# Patient Record
Sex: Male | Born: 1995 | Race: Black or African American | Hispanic: No | Marital: Single | Smoking: Never smoker
Health system: Southern US, Community
[De-identification: ages and names within clinical notes are randomized; demographics above are authoritative.]

## PROBLEM LIST (undated history)

## (undated) DIAGNOSIS — M899 Disorder of bone, unspecified: Secondary | ICD-10-CM

## (undated) DIAGNOSIS — J309 Allergic rhinitis, unspecified: Secondary | ICD-10-CM

## (undated) DIAGNOSIS — E669 Obesity, unspecified: Secondary | ICD-10-CM

## (undated) DIAGNOSIS — D573 Sickle-cell trait: Secondary | ICD-10-CM

## (undated) HISTORY — DX: Allergic rhinitis, unspecified: J30.9

## (undated) HISTORY — DX: Obesity, unspecified: E66.9

## (undated) HISTORY — DX: Disorder of bone, unspecified: M89.9

## (undated) HISTORY — DX: Sickle-cell trait: D57.3

---
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
## 2015-01-08 ENCOUNTER — Telehealth: Payer: Self-pay | Admitting: Internal Medicine

## 2015-01-08 NOTE — Telephone Encounter (Signed)
Called patient to confirm appt for 01/09/15 at 9:15  Patient confirmed

## 2015-01-09 ENCOUNTER — Ambulatory Visit (INDEPENDENT_AMBULATORY_CARE_PROVIDER_SITE_OTHER): Payer: BLUE CROSS/BLUE SHIELD | Admitting: Internal Medicine

## 2015-01-09 ENCOUNTER — Encounter: Payer: Self-pay | Admitting: Internal Medicine

## 2015-01-09 VITALS — BP 139/57 | HR 56 | Temp 98.5°F | Ht 73.0 in | Wt 242.6 lb

## 2015-01-09 DIAGNOSIS — G44229 Chronic tension-type headache, not intractable: Secondary | ICD-10-CM | POA: Insufficient documentation

## 2015-01-09 DIAGNOSIS — J329 Chronic sinusitis, unspecified: Secondary | ICD-10-CM | POA: Diagnosis not present

## 2015-01-09 DIAGNOSIS — J324 Chronic pansinusitis: Secondary | ICD-10-CM

## 2015-01-09 DIAGNOSIS — R51 Headache: Secondary | ICD-10-CM

## 2015-01-09 MED ORDER — FLUTICASONE PROPIONATE 50 MCG/ACT NA SUSP: 2.0000 | Freq: Every day | NASAL | Status: DC

## 2015-01-09 MED ORDER — IBUPROFEN 200 MG PO TABS: 400.0000 mg | ORAL_TABLET | Freq: Four times a day (QID) | ORAL | Status: DC | PRN

## 2015-01-09 MED ORDER — CETIRIZINE HCL 5 MG PO TABS: 10.0000 mg | ORAL_TABLET | Freq: Every day | ORAL | Status: DC

## 2015-01-09 NOTE — Patient Instructions (Signed)
You are likely experiencing tension type headaches caused by your sinusitis. Take ibuprofen for you headaches when they occur. Use Cetirizine 10 mg and Flonase 2 sprays each nostril every day to help with your sinusitis and also help prevent headaches. If you continue to have these headaches in one month and they are not getting better, please return to the clinic.  Tension Headache A tension headache is a feeling of pain, pressure, or aching that is often felt over the front and sides of the head. The pain can be dull, or it can feel tight (constricting). Tension headaches are not normally associated with nausea or vomiting, and they do not get worse with physical activity. Tension headaches can last from 30 minutes to several days. This is the most common type of headache. CAUSES The exact cause of this condition is not known. Tension headaches often begin after stress, anxiety, or depression. Other triggers may include:  Alcohol.  Too much caffeine, or caffeine withdrawal.  Respiratory infections, such as colds, flu, or sinus infections.  Dental problems or teeth clenching.  Fatigue.  Holding your head and neck in the same position for a long period of time, such as while using a computer.  Smoking. SYMPTOMS Symptoms of this condition include:  A feeling of pressure around the head.  Dull, aching head pain.  Pain felt over the front and sides of the head.  Tenderness in the muscles of the head, neck, and shoulders. DIAGNOSIS This condition may be diagnosed based on your symptoms and a physical exam. Tests may be done, such as a CT scan or an MRI of your head. These tests may be done if your symptoms are severe or unusual. TREATMENT This condition may be treated with lifestyle changes and medicines to help relieve symptoms. HOME CARE INSTRUCTIONS Managing Pain  Take over-the-counter and prescription medicines only as told by your health care provider.  Lie down in a dark,  quiet room when you have a headache.  If directed, apply ice to the head and neck area:  Put ice in a plastic bag.  Place a towel between your skin and the bag.  Leave the ice on for 20 minutes, 2-3 times per day.  Use a heating pad or a hot shower to apply heat to the head and neck area as told by your health care provider. Eating and Drinking  Eat meals on a regular schedule.  Limit alcohol use.  Decrease your caffeine intake, or stop using caffeine. General Instructions  Keep all follow-up visits as told by your health care provider. This is important.  Keep a headache journal to help find out what may trigger your headaches. For example, write down:  What you eat and drink.  How much sleep you get.  Any change to your diet or medicines.  Try massage or other relaxation techniques.  Limit stress.  Sit up straight, and avoid tensing your muscles.  Do not use tobacco products, including cigarettes, chewing tobacco, or e-cigarettes. If you need help quitting, ask your health care provider.  Exercise regularly as told by your health care provider.  Get 7-9 hours of sleep, or the amount recommended by your health care provider. SEEK MEDICAL CARE IF:  Your symptoms are not helped by medicine.  You have a headache that is different from what you normally experience.  You have nausea or you vomit.  You have a fever. SEEK IMMEDIATE MEDICAL CARE IF:  Your headache becomes severe.  You have repeated  vomiting.  You have a stiff neck.  You have a loss of vision.  You have problems with speech.  You have pain in your eye or ear.  You have muscular weakness or loss of muscle control.  You lose your balance or you have trouble walking.  You feel faint or you pass out.  You have confusion.   This information is not intended to replace advice given to you by your health care provider. Make sure you discuss any questions you have with your health care  provider.   Document Released: 12/20/2004 Document Revised: 09/10/2014 Document Reviewed: 04/14/2014 Elsevier Interactive Patient Education 2016 [employer]  Cluster Headache Cluster headaches are recognized by their pattern of deep, intense head pain. They normally occur on one side of your head, but they may "switch sides" in subsequent episodes. Typically, cluster headaches:   Are severe in nature.   Occur repeatedly over weeks to months and are followed by periods of no headaches.   Can last from 15 minutes to 3 hours.   Occur at the same time each day, often at night.   Occur several times a day. CAUSES The exact cause of cluster headaches is not known. Alcohol use may be associated with cluster headaches. SIGNS AND SYMPTOMS   Severe pain that begins in or around your eye or temple.   One-sided head pain.   Feeling sick to your stomach (nauseous).   Sensitivity to light.   Runny nose.   Eye redness, tearing, and nasal stuffiness on the side of your head where you are experiencing pain.   Sweaty, pale skin of the face.   Droopy or swollen eyelid.   Restlessness. DIAGNOSIS  Cluster headaches are diagnosed based on symptoms and a physical exam. Your health care provider may order a CT scan or an MRI of your head or lab tests to see if your headaches are caused by other medical conditions.  TREATMENT   Medicines for pain relief and to prevent recurrent attacks. Some people may need a combination of medicines.  Oxygen for pain relief.   Biofeedback programs to help reduce headache pain.  It may be helpful to keep a headache diary. This may help you find a trend for what is triggering your headaches. Your health care provider can develop a treatment plan.  HOME CARE INSTRUCTIONS  During cluster periods:   Follow a regular sleep schedule. Do not vary the amount and time that you sleep from day to day. It is important to stay on the same schedule  during a cluster period to help prevent headaches.   Avoid alcohol.   Stop smoking if you smoke.  SEEK MEDICAL CARE IF:  You have any changes from your previous cluster headaches either in intensity or frequency.   You are not getting relief from medicines you are taking.  SEEK IMMEDIATE MEDICAL CARE IF:   You faint.   You have weakness or numbness, especially on one side of your body or face.   You have double vision.   You have nausea or vomiting that is not relieved within several hours.   You cannot keep your balance or have difficulty talking or walking.   You have neck pain or stiffness.   You have a fever. MAKE SURE YOU:  Understand these instructions.   Will watch your condition.   Will get help right away if you are not doing well or get worse.   This information is not intended to replace advice  given to you by your health care provider. Make sure you discuss any questions you have with your health care provider.   Document Released: 12/20/2004 Document Revised: 10/10/2012 Document Reviewed: 07/12/2012 Elsevier Interactive Patient Education [employer]

## 2015-01-09 NOTE — Assessment & Plan Note (Signed)
Patient presents with a  1-2 month history of right sided headaches located behind right eye and right temple. Episodes occur every other day or every 2-3 days and last for about one hour. Pain is throbbing and tension like. At its worst it gets to a 10/10. It is sometimes associated with tearing of the right eye, but is not associated with nausea, vomiting, fever, photophobia, phonophobia, eyelid edema or conjunctival injection. When pain occurs, he will rest and put pressure on the area. Pain resolves on its own in 1 hour. He has tried advil one time with some relief. He has never had headaches like this before. He denies any new activities, medicines or supplements. He does admits to feeling more congested recently which is a chronic problem for him.  Assessment: Tension-type headache 2/2 chronic sinusitis Other DDx include Cluster headache and migraine headache, but less likely.  Plan: -Advil 400 mg Q6H prn pain -Flonase and Cetirizine for sinusitis -Provided information on tension headache -Return in one month if sometime are not improved, consider cluster headache at that time

## 2015-01-09 NOTE — Progress Notes (Signed)
   Subjective:    Patient ID: Jose Williams, male    DOB: 03/06/1995, 20 y.o.   MRN: [id number]  HPI Jose Williams is a 20 y.o. male with PMHx of sickle cell trait, allergic rhinitis who presents to the clinic for migraines. Please see A&P for the status of the patient's chronic medical problems.   Past Medical History  Diagnosis Date  . Bone lesion 04/25/2014  . Allergic rhinitis 04/25/2014  . Obesity 04/25/2014  . Sickle cell trait (HCC) 04/25/2014    Outpatient Encounter Prescriptions as of 01/09/2015  Medication Sig Note  . levocetirizine (XYZAL) 5 MG tablet Take 5 mg by mouth daily. 04/25/2014: Received from: External Pharmacy   No facility-administered encounter medications on file as of 01/09/2015.    No family history on file.  Social History   Social History  . Marital Status: Single    Spouse Name: N/A  . Number of Children: N/A  . Years of Education: N/A   Occupational History  . Not on file.   Social History Main Topics  . Smoking status: Never Smoker   . Smokeless tobacco: Not on file  . Alcohol Use: No  . Drug Use: Not on file  . Sexual Activity: Not on file   Other Topics Concern  . Not on file   Social History Narrative   Review of Systems General: Denies fever, chills, fatigue.  HEENT: Admits to conjunctival lacrimation, pain behind right eye, chronic nasal congestion and sinus pressure. Denies conjunctival injection or eyelid edema. Respiratory: Denies SOB, cough, chest tightness, and wheezing.   Cardiovascular: Denies chest pain and palpitations.  Gastrointestinal: Denies nausea, vomiting, diarrhea, constipation Skin: Denies rash.  Neurological: Admits to right sided headaches. Denies dizziness, weakness, lightheadedness, numbness, Psychiatric/Behavioral: Denies confusion or increased stress.     Objective:   Physical Exam Filed Vitals:   01/09/15 0917  BP: 139/57  Pulse: 56  Temp: 98.5 F (36.9 C)  TempSrc: Oral  Height: 6\' 1"   (1.854 m)  Weight: 242 lb 9.6 oz (110.043 kg)  SpO2: 100%   General: Vital signs reviewed.  Patient is well-developed and well-nourished, in no acute distress and cooperative with exam.  Head: Normocephalic and atraumatic. Tender on palpation of frontal and maxillary sinuses. Eyes: EOMI, conjunctivae normal, no conjunctival injection or eyelid edema.  Nose: Edematous, erythematous nasal turbinates.  Mouth: Normal posterior oropharynx. Neck: Supple, trachea midline, normal ROM.  Cardiovascular: RRR, S1 normal, S2 normal, no murmurs, gallops, or rubs. Pulmonary/Chest: Clear to auscultation bilaterally, no wheezes, rales, or rhonchi. Abdominal: Soft, non-tender, non-distended, BS + Extremities: No lower extremity edema bilaterally Neurological: Strength is normal and symmetric bilaterally, no focal motor deficit, sensory intact to light touch bilaterally.  Skin: Warm, dry and intact.     Assessment & Plan:   Please see problem based assessment and plan.

## 2015-01-09 NOTE — Assessment & Plan Note (Signed)
Patient admits to worsening nasal congestion and sinus pressure, worse on the right, that is chronic for him. He was previously on Xyzal, but has not been taking it.   Assessment: Chronic Sinusitis  Plan: -Flonase 2 sprays each nostril QD -Cetirizine 10 mg QD -Follow up if symptoms not improved -Consider ENT referral if worsening

## 2015-01-12 NOTE — Progress Notes (Signed)
Internal Medicine Clinic Attending  Case discussed with Dr. Richardson at the time of the visit.  We reviewed the resident's history and exam and pertinent patient test results.  I agree with the assessment, diagnosis, and plan of care documented in the resident's note. 

## 2015-08-14 ENCOUNTER — Encounter: Payer: Self-pay | Admitting: Internal Medicine

## 2015-08-14 ENCOUNTER — Ambulatory Visit (INDEPENDENT_AMBULATORY_CARE_PROVIDER_SITE_OTHER): Payer: BLUE CROSS/BLUE SHIELD | Admitting: Internal Medicine

## 2015-08-14 VITALS — BP 139/77 | HR 85 | Temp 98.8°F | Wt 223.4 lb

## 2015-08-14 DIAGNOSIS — J329 Chronic sinusitis, unspecified: Secondary | ICD-10-CM

## 2015-08-14 DIAGNOSIS — J320 Chronic maxillary sinusitis: Secondary | ICD-10-CM

## 2015-08-14 DIAGNOSIS — J324 Chronic pansinusitis: Secondary | ICD-10-CM

## 2015-08-14 MED ORDER — SALINE SPRAY 0.65 % NA SOLN: 2.0000 | NASAL | 2 refills | Status: DC | PRN

## 2015-08-14 MED ORDER — FLUTICASONE PROPIONATE 50 MCG/ACT NA SUSP: 2.0000 | Freq: Every day | NASAL | 3 refills | Status: DC

## 2015-08-14 MED ORDER — FEXOFENADINE-PSEUDOEPHED ER 60-120 MG PO TB12: 1.0000 | ORAL_TABLET | Freq: Two times a day (BID) | ORAL | 2 refills | Status: AC

## 2015-08-14 NOTE — Patient Instructions (Signed)
It was pleasure taking care of you. I stopped your Cetrizine and gave you allegra D , see it might work for you. Do saline nasal spray to help with your congestion. We are refering you to a ENT specialist, You will get a call about your appointment.

## 2015-08-14 NOTE — Assessment & Plan Note (Signed)
According to patient his nasal congestion is not getting better inspite of taking Zyrtec and Flonase regularly. He is having moderate amount of edema and erythema bilaterally with some clear nasal discharge. -Try Allegra D and stop Zyrtec -Saline nasal spray -Continue Flonase -ENT referral

## 2015-08-14 NOTE — Progress Notes (Signed)
Internal Medicine Clinic Attending  I saw and evaluated the patient.  I personally confirmed the key portions of the history and exam documented by Dr. Amin and I reviewed pertinent patient test results.  The assessment, diagnosis, and plan were formulated together and I agree with the documentation in the resident's note. 

## 2015-08-14 NOTE — Progress Notes (Signed)
   CC: Sinus Congestion  HPI: Mr. Pamala HurryMarcquez D Puett is a 20 y.o. male with a PMHx of chronic allergic sinusitis, sickle cell trait, presented today at clinic with nasal congestion since long time. He was given Zyrtec and Flonase in the past and it's not helping. He has an h/o seasonal allergies. He admits having some post nasal drip and unable to sleep at night due to nasal congestion.He denies any headache,facial pain,change in vision, sore throat,fever, SOB,chest pain.  Mr.Izzak D Mercy RidingBurgess is a 20 y.o.   Past Medical History:  Diagnosis Date  . Allergic rhinitis 04/25/2014  . Bone lesion 04/25/2014  . Obesity 04/25/2014  . Sickle cell trait (HCC) 04/25/2014    Review of Systems:  Constitutional:  denies fever, chills, diaphoresis, appetite change and fatigue.  HEENT: denies photophobia, eye pain, redness, hearing loss, ear pain,Nasal congestion, sore throat, rhinorrhea, sneezing, neck pain, neck stiffness and tinnitus.  Respiratory: denies SOB, DOE, cough, chest tightness, and wheezing.  Cardiovascular: denies chest pain, palpitations and leg swelling.  Gastrointestinal: denies nausea, vomiting, abdominal pain, diarrhea, constipation, blood in stool.  Genitourinary: denies dysuria, urgency, frequency, hematuria, flank pain and difficulty urinating.  Musculoskeletal: denies  myalgias, back pain, joint swelling, arthralgias and gait problem.   Skin: denies pallor, rash and wound.  Neurological: denies dizziness, seizures, syncope, weakness, light-headedness, numbness and headaches.   Hematological: denies adenopathy, easy bruising, personal or family bleeding history.  Psychiatric/ Behavioral: denies suicidal ideation, mood changes, confusion, nervousness, sleep disturbance and agitation.    Physical Exam:  Vitals:   08/14/15 1352  BP: 139/77  Pulse: 85  Temp: 98.8 F (37.1 C)  TempSrc: Oral  SpO2: 100%  Weight: 223 lb 6.4 oz (101.3 kg)   General: Well built, well nourished,  comfortable, no acute distress. HEENT: PERRLA, no conjunctival erythema or scleral icterus EAR: Normal ear drum with wax in both ears. Nose: Bilateral erythema, edema with clear nasal discharge present in both nares. Throat: Erythematous enlarged tonsils. No exudate. Face: No tenderness,or edema. Neck: No lymphadenopathy, supple, No thyromegaly. Chest: Clear bilaterally, No added sounds. CVS: RRR, No R/M/G. Abdomen: Soft, non tender, non distended. BS +ve. CNS: Grossly intact.   Assessment & Plan:   See Encounters Tab for problem based charting.  Patient seen with Dr. Heide SparkNarendra

## 2015-10-23 NOTE — Addendum Note (Signed)
Addended by: Neomia DearPOWERS, Kalee Broxton E on: 10/23/2015 04:47 PM   Modules accepted: Orders

## 2016-04-18 IMAGING — DX DG ANKLE COMPLETE 3+V*L*
3 series · 3 of 3 positions shown · non-contrast
Comparison: 05/02/2012

CLINICAL DATA: Pain over the left ankle with no known injury.

EXAM:
LEFT ANKLE COMPLETE - 3+ VIEW

[ankle ap]
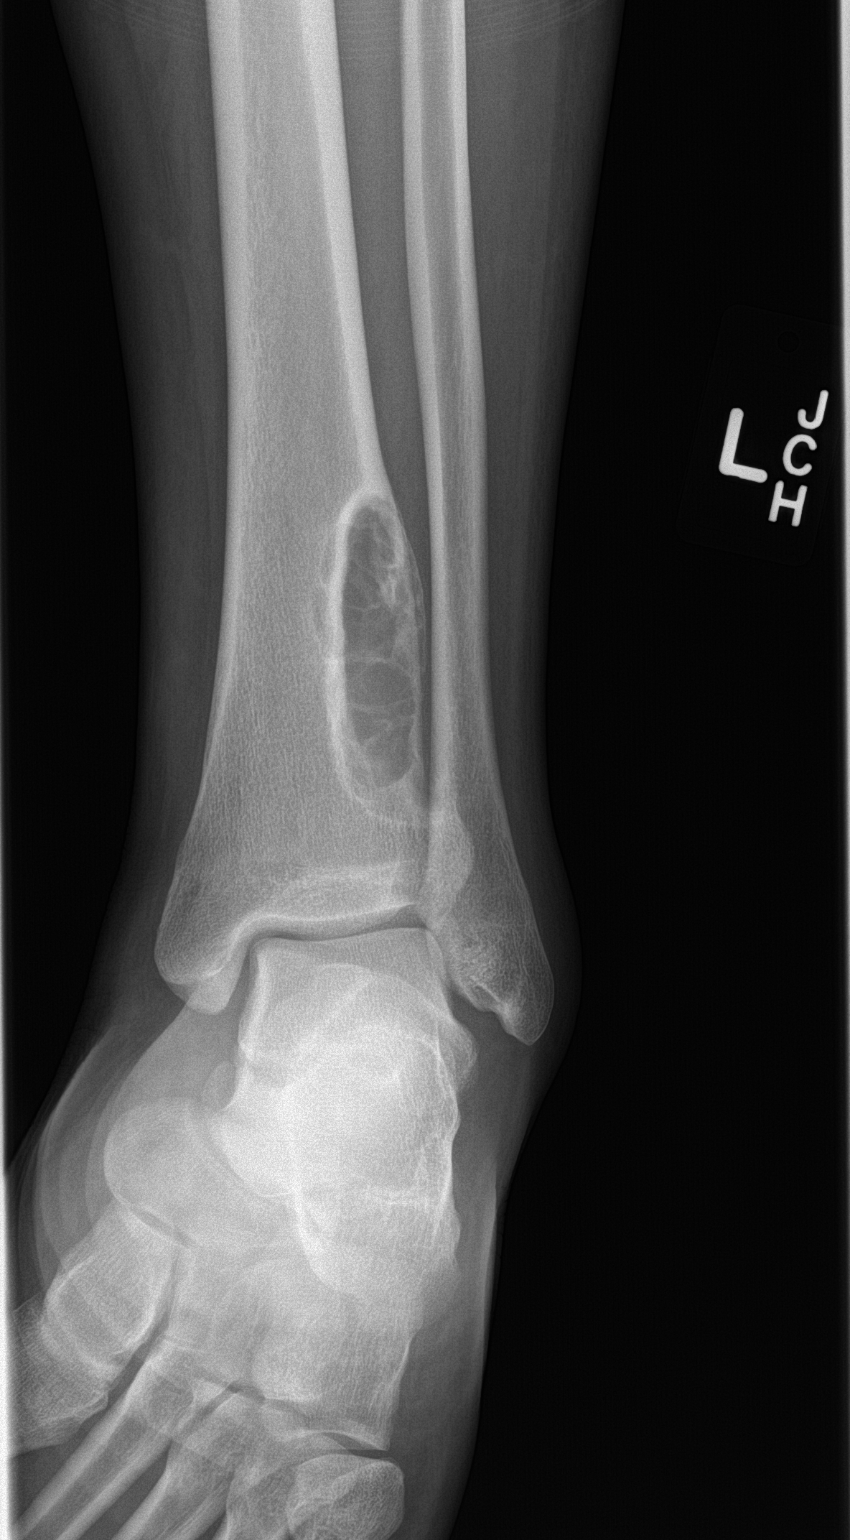

[ankle obl]
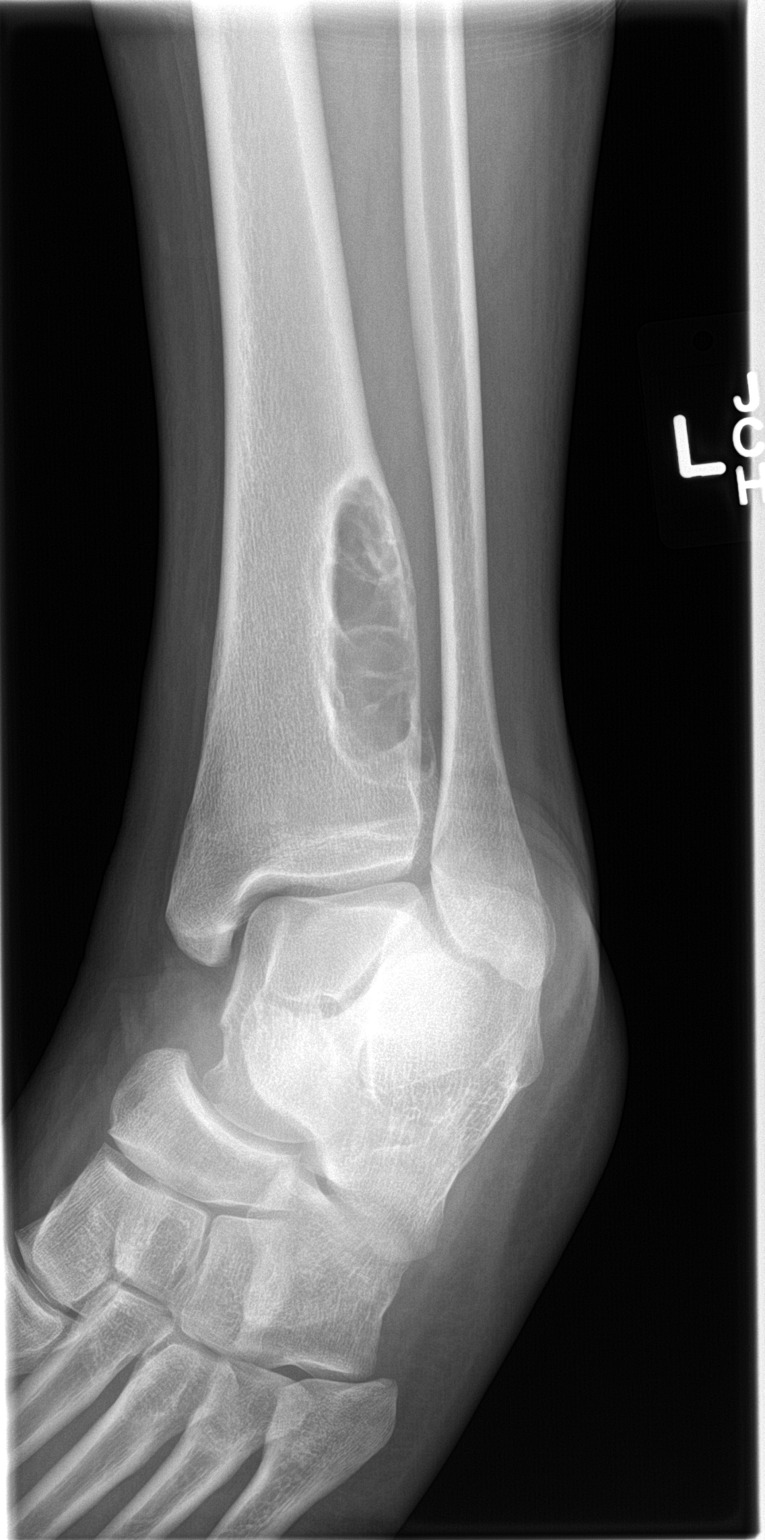

[ankle lat]
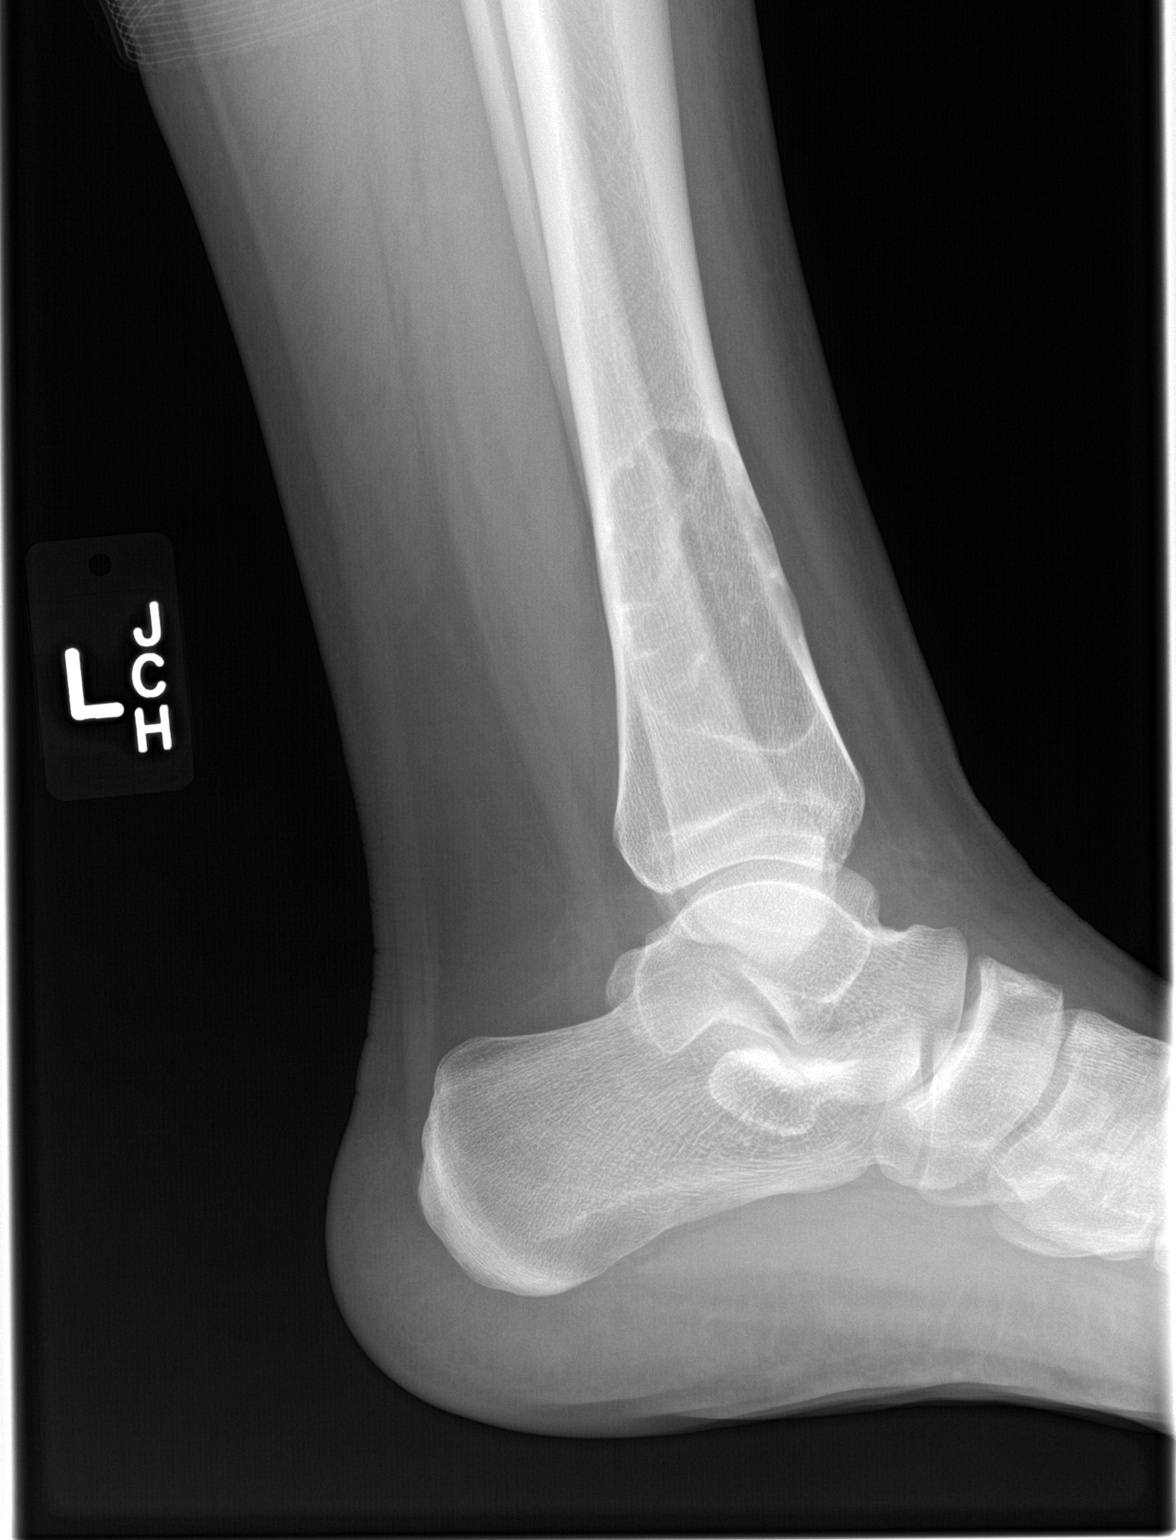

[3 of 3 positions shown; findings below may reference images not displayed]

FINDINGS: An eccentric lytic lesion in the lateral distal tibial metadiaphysis
has enlarged from prior, now 62 x 32 x 18 mm (previously 52 x 24 x
13 mm). There is unchanged cortical breakthrough inferiorly. The
medial margins are lobulated and continuously
sclerotic/benign-appearing. Despite the bubbly expansile appearance,
location would be atypical for an aneurysmal bone cyst. No
periosteal reaction. No extension to the articular surface. The
physes these have closed since previous. No pathologic fracture to
explain worsening pain.
IMPRESSION: 1. A lytic bone lesion in the cortex of the distal tibia has
enlarged from 3115. Chondromyxoid fibroma or fibrous dysplasia
should be considered in addition to nonossifying fibroma. Recommend
orthopedic referral for consideration of curettage.
2. No pathologic fracture.

## 2016-06-15 ENCOUNTER — Encounter (HOSPITAL_COMMUNITY): Payer: Self-pay | Admitting: *Deleted

## 2016-06-15 ENCOUNTER — Emergency Department (HOSPITAL_COMMUNITY)
Admission: EM | Admit: 2016-06-15 | Discharge: 2016-06-15 | Disposition: A | Discharge status: Home / Self Care | Payer: BLUE CROSS/BLUE SHIELD | Attending: Emergency Medicine | Admitting: Emergency Medicine

## 2016-06-15 DIAGNOSIS — J028 Acute pharyngitis due to other specified organisms: Secondary | ICD-10-CM

## 2016-06-15 DIAGNOSIS — J029 Acute pharyngitis, unspecified: Secondary | ICD-10-CM | POA: Insufficient documentation

## 2016-06-15 LAB — RAPID STREP SCREEN (MED CTR MEBANE ONLY): Streptococcus, Group A Screen (Direct): NEGATIVE

## 2016-06-15 MED ORDER — IBUPROFEN 100 MG/5ML PO SUSP
600.0000 mg | Freq: Once | ORAL | Status: AC
  Administered 2016-06-15: 600 mg via ORAL
  Filled 2016-06-15: qty 30

## 2016-06-15 NOTE — ED Triage Notes (Signed)
Pt states sore throat, chest pain with cough and body aches x 2 days.

## 2016-06-15 NOTE — ED Provider Notes (Signed)
[facility]-EMERGENCY DEPT Provider Note   CSN: [id number] Arrival date & time: 06/15/16  4098     History   Chief Complaint Chief Complaint  Patient presents with  . Sore Throat  . Cough    HPI Jose Williams is a 21 y.o. male.  The history is provided by the patient.  Influenza  Presenting symptoms: cough, fatigue, fever (subjective), myalgias, rhinorrhea and sore throat   Presenting symptoms: no nausea and no vomiting   Severity:  Moderate Onset quality:  Gradual Duration:  3 days Progression:  Unchanged Chronicity:  New Relieved by:  Nothing Worsened by:  Nothing Ineffective treatments:  OTC medications Associated symptoms: chills and nasal congestion   Risk factors: no diabetes problem, no immunocompromised state and no sick contacts (but unsure; works at [employer])     Past Medical History:  Diagnosis Date  . Allergic rhinitis 04/25/2014  . Bone lesion 04/25/2014  . Obesity 04/25/2014  . Sickle cell trait (HCC) 04/25/2014    Patient Active Problem List   Diagnosis Date Noted  . Sinusitis, chronic 01/09/2015  . Chronic tension headaches 01/09/2015  . Sickle cell trait (HCC) 04/25/2014  . Obesity 04/25/2014  . Bone lesion 04/25/2014    History reviewed. No pertinent surgical history.     Home Medications    Prior to Admission medications   Medication Sig Start Date End Date Taking? Authorizing Provider  fexofenadine-pseudoephedrine (ALLEGRA-D) 60-120 MG 12 hr tablet Take 1 tablet by mouth 2 (two) times daily. Patient not taking: Reported on 06/15/2016 08/14/15 08/13/16  Arnetha Courser, MD  fluticasone [facility]) 50 MCG/ACT nasal spray Place 2 sprays into both nostrils daily. Patient not taking: Reported on 06/15/2016 08/14/15   Arnetha Courser, MD  ibuprofen (ADVIL,MOTRIN) 200 MG tablet Take 2 tablets (400 mg total) by mouth every 6 (six) hours as needed. Patient not taking: Reported on 06/15/2016 01/09/15   Servando Snare, MD  sodium chloride (OCEAN) 0.65 % SOLN  nasal spray Place 2 sprays into both nostrils as needed for congestion. Patient not taking: Reported on 06/15/2016 08/14/15   Arnetha Courser, MD    Family History No family history on file.  Social History Social History  Substance Use Topics  . Smoking status: Never Smoker  . Smokeless tobacco: Never Used  . Alcohol use No     Allergies   Patient has no known allergies.   Review of Systems Review of Systems  Constitutional: Positive for chills, fatigue and fever (subjective).  HENT: Positive for congestion, rhinorrhea and sore throat.   Respiratory: Positive for cough.   Gastrointestinal: Negative for nausea and vomiting.  Musculoskeletal: Positive for myalgias.     Physical Exam Updated Vital Signs BP 107/70   Pulse (!) 109   Temp 99.6 F (37.6 C) (Oral)   Resp 18   Ht 6\' 1"  (1.854 m)   Wt 106.7 kg (235 lb 3.2 oz)   SpO2 98%   BMI 31.03 kg/m   Physical Exam  Constitutional: He is oriented to person, place, and time. He appears well-developed and well-nourished. No distress.  HENT:  Head: Normocephalic and atraumatic.  Nose: Nose normal.  Mouth/Throat: No oropharyngeal exudate, posterior oropharyngeal edema or tonsillar abscesses. Tonsils are 2+ on the right. Tonsillar exudate.  Eyes: Conjunctivae and EOM are normal. Pupils are equal, round, and reactive to light. Right eye exhibits no discharge. Left eye exhibits no discharge. No scleral icterus.  Neck: Normal range of motion. Neck supple.  Cardiovascular: Normal rate and regular rhythm.  Exam reveals no gallop and no friction rub.   No murmur heard. Pulmonary/Chest: Effort normal and breath sounds normal. No stridor. No respiratory distress. He has no rales.  Abdominal: Soft. He exhibits no distension. There is no tenderness.  Musculoskeletal: He exhibits no edema or tenderness.  Neurological: He is alert and oriented to person, place, and time.  Skin: Skin is warm and dry. No rash noted. He is not  diaphoretic. No erythema.  Psychiatric: He has a normal mood and affect.  Vitals reviewed.    ED Treatments / Results  Labs (all labs ordered are listed, but only abnormal results are displayed) Labs Reviewed  RAPID [facility]RE[facility])  CULTURE, GROUP A STREP [facility])    EKG  EKG Interpretation None       Radiology No results found.  Procedures Procedures (including critical care time)  Medications Ordered in ED Medications  ibuprofen (ADVIL,MOTRIN) 100 MG/5ML suspension 600 mg (600 mg Oral Given 06/15/16 1249)     Initial Impression / Assessment and Plan / ED Course  I have reviewed the triage vital signs and the nursing notes.  Pertinent labs & imaging results that were available during my care of the patient were reviewed by me and considered in my medical decision making (see chart for details).     Consistent with pharyngitis. Rapid strep negative. Given the other URI symptoms, likely viral process. Supportive care recommended.  The patient is safe for discharge with strict return precautions.   Final Clinical Impressions(s) / ED Diagnoses   Final diagnoses:  Acute pharyngitis due to other specified organisms   Disposition: Discharge  Condition: Good  I have discussed the results, Dx and Tx plan with the patient who expressed understanding and agree(s) with the plan. Discharge instructions discussed at great length. The patient was given strict return precautions who verbalized understanding of the instructions. No further questions at time of discharge.    Discharge Medication List as of 06/15/2016 12:20 PM      Follow Up: Primary care provider         Nira Connardama, Ovie Eastep Eduardo, MD 06/17/16 1601

## 2016-06-17 LAB — CULTURE, GROUP A STREP (THRC)

## 2018-04-09 ENCOUNTER — Ambulatory Visit (HOSPITAL_COMMUNITY)
Admission: EM | Admit: 2018-04-09 | Discharge: 2018-04-09 | Disposition: A | Discharge status: Home / Self Care | Payer: BLUE CROSS/BLUE SHIELD | Attending: Family Medicine | Admitting: Family Medicine

## 2018-04-09 ENCOUNTER — Other Ambulatory Visit: Payer: Self-pay

## 2018-04-09 ENCOUNTER — Ambulatory Visit (INDEPENDENT_AMBULATORY_CARE_PROVIDER_SITE_OTHER): Payer: BLUE CROSS/BLUE SHIELD

## 2018-04-09 ENCOUNTER — Encounter (HOSPITAL_COMMUNITY): Payer: Self-pay

## 2018-04-09 DIAGNOSIS — R0789 Other chest pain: Secondary | ICD-10-CM | POA: Diagnosis not present

## 2018-04-09 DIAGNOSIS — R079 Chest pain, unspecified: Secondary | ICD-10-CM | POA: Diagnosis not present

## 2018-04-09 MED ORDER — IBUPROFEN 800 MG PO TABS: 800.0000 mg | ORAL_TABLET | Freq: Three times a day (TID) | ORAL | 0 refills | Status: DC

## 2018-04-09 MED ORDER — TIZANIDINE HCL 4 MG PO TABS: 4.0000 mg | ORAL_TABLET | Freq: Four times a day (QID) | ORAL | 0 refills | Status: DC | PRN
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
## 2018-04-09 NOTE — ED Notes (Signed)
Patient verbalizes understanding of discharge instructions. Opportunity for questioning and answers were provided. Patient discharged from [facility] by RN.  

## 2018-08-20 ENCOUNTER — Other Ambulatory Visit: Payer: Self-pay

## 2018-08-20 DIAGNOSIS — Z20822 Contact with and (suspected) exposure to covid-19: Secondary | ICD-10-CM

## 2018-08-22 LAB — SPECIMEN STATUS REPORT

## 2018-08-22 LAB — NOVEL CORONAVIRUS, NAA: SARS-CoV-2, NAA: DETECTED — AB

## 2019-01-08 ENCOUNTER — Ambulatory Visit: Payer: Medicaid Other | Attending: Internal Medicine

## 2019-01-08 DIAGNOSIS — Z20822 Contact with and (suspected) exposure to covid-19: Secondary | ICD-10-CM

## 2019-01-09 LAB — NOVEL CORONAVIRUS, NAA: SARS-CoV-2, NAA: NOT DETECTED

## 2019-04-22 ENCOUNTER — Other Ambulatory Visit: Payer: Self-pay

## 2019-04-22 ENCOUNTER — Encounter (HOSPITAL_COMMUNITY): Payer: Self-pay

## 2019-04-22 ENCOUNTER — Ambulatory Visit (HOSPITAL_COMMUNITY)
Admission: EM | Admit: 2019-04-22 | Discharge: 2019-04-22 | Disposition: A | Discharge status: Home / Self Care | Payer: Medicaid Other | Attending: Internal Medicine | Admitting: Internal Medicine

## 2019-04-22 DIAGNOSIS — Z20822 Contact with and (suspected) exposure to covid-19: Secondary | ICD-10-CM

## 2019-04-22 NOTE — ED Provider Notes (Signed)
[facility]    CSN: [id number] Arrival date & time: 04/22/19  1030      History   Chief Complaint Chief Complaint  Patient presents with  . Covid Exposure    HPI Jose Williams is a 24 y.o. male comes to urgent care for COVID-19 testing.  Patient was exposed to COVID-19 positive individual.  Patient has younger sister has COVID-19 infection.  Patient has no symptoms.Marland Kitchen   HPI  Past Medical History:  Diagnosis Date  . Allergic rhinitis 04/25/2014  . Bone lesion 04/25/2014  . Obesity 04/25/2014  . Sickle cell trait (HCC) 04/25/2014    Patient Active Problem List   Diagnosis Date Noted  . Sinusitis, chronic 01/09/2015  . Chronic tension headaches 01/09/2015  . Sickle cell trait (HCC) 04/25/2014  . Obesity 04/25/2014  . Bone lesion 04/25/2014    History reviewed. No pertinent surgical history.     Home Medications    Prior to Admission medications   Medication Sig Start Date End Date Taking? Authorizing Provider  ibuprofen (ADVIL,MOTRIN) 800 MG tablet Take 1 tablet (800 mg total) by mouth 3 (three) times daily. 04/09/18   Eustace Moore, MD  tiZANidine (ZANAFLEX) 4 MG tablet Take 1-2 tablets (4-8 mg total) by mouth every 6 (six) hours as needed for muscle spasms. 04/09/18   Eustace Moore, MD    Family History History reviewed. No pertinent family history.  Social History Social History   Tobacco Use  . Smoking status: Never Smoker  . Smokeless tobacco: Never Used  Substance Use Topics  . Alcohol use: No    Alcohol/week: 0.0 standard drinks  . Drug use: No     Allergies   Patient has no known allergies.   Review of Systems Review of Systems  Constitutional: Negative.   HENT: Negative.   Respiratory: Negative.   Cardiovascular: Negative.   Genitourinary: Negative.      Physical Exam Triage Vital Signs ED Triage Vitals  Enc Vitals Group     BP 04/22/19 1154 119/68     Pulse Rate 04/22/19 1154 86     Resp 04/22/19 1154 18     Temp 04/22/19 1154 97.9 F (36.6 C)     Temp src --      SpO2 04/22/19 1154 100 %     Weight 04/22/19 1153 240 lb (108.9 kg)     Height --      Head Circumference --      Peak Flow --      Pain Score 04/22/19 1152 0     Pain Loc --      Pain Edu? --      Excl. in GC? --    No data found.  Updated Vital Signs BP 119/68 (BP Location: Right Arm)   Pulse 86   Temp 97.9 F (36.6 C)   Resp 18   Wt 108.9 kg   SpO2 100%   BMI 32.55 kg/m   Visual Acuity Right Eye Distance:   Left Eye Distance:   Bilateral Distance:    Right Eye Near:   Left Eye Near:    Bilateral Near:     Physical Exam Vitals and nursing note reviewed.  Constitutional:      General: He is not in acute distress.    Appearance: Normal appearance. He is not ill-appearing.  Cardiovascular:     Rate and Rhythm: Normal rate and regular rhythm.     Pulses: Normal pulses.     Heart  sounds: Normal heart sounds.  Skin:    Capillary Refill: Capillary refill takes less than 2 seconds.  Neurological:     General: No focal deficit present.     Mental Status: He is alert and oriented to person, place, and time.      UC Treatments / Results  Labs (all labs ordered are listed, but only abnormal results are displayed) Labs Reviewed  SARS CORONAVIRUS 2 (TAT 6-24 HRS)    EKG   Radiology No results found.  Procedures Procedures (including critical care time)  Medications Ordered in UC Medications - No data to display  Initial Impression / Assessment and Plan / UC Course  I have reviewed the triage vital signs and the nursing notes.  Pertinent labs & imaging results that were available during my care of the patient were reviewed by me and considered in my medical decision making (see chart for details).     1.  Close exposure to COVID-19 virus: COVID-19 PCR test has been sent Patient is advised to quarantine until COVID-19 test results are available Is encouraged to sign up for MyChart and check  his results through MyChart If the COVID-19 test is significant, we will reach out to the patient with further advice regarding self-isolation. Final Clinical Impressions(s) / UC Diagnoses   Final diagnoses:  Close exposure to COVID-19 virus   Discharge Instructions   None    ED Prescriptions    None     PDMP not reviewed this encounter.   Chase Picket, MD 04/22/19 1240

## 2019-04-22 NOTE — ED Triage Notes (Signed)
Pt states he needs to be tested for Covid because his little sister has it.

## 2019-04-23 LAB — SARS CORONAVIRUS 2 (TAT 6-24 HRS): SARS Coronavirus 2: NEGATIVE
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
## 2020-10-04 ENCOUNTER — Encounter (HOSPITAL_COMMUNITY): Payer: Self-pay

## 2020-10-04 ENCOUNTER — Ambulatory Visit (HOSPITAL_COMMUNITY)
Admission: EM | Admit: 2020-10-04 | Discharge: 2020-10-04 | Disposition: A | Discharge status: Home / Self Care | Payer: Medicaid Other | Attending: Internal Medicine | Admitting: Internal Medicine

## 2020-10-04 ENCOUNTER — Other Ambulatory Visit: Payer: Self-pay

## 2020-10-04 DIAGNOSIS — Z113 Encounter for screening for infections with a predominantly sexual mode of transmission: Secondary | ICD-10-CM | POA: Insufficient documentation

## 2020-10-04 DIAGNOSIS — Z20822 Contact with and (suspected) exposure to covid-19: Secondary | ICD-10-CM | POA: Insufficient documentation

## 2020-10-04 DIAGNOSIS — J029 Acute pharyngitis, unspecified: Secondary | ICD-10-CM | POA: Insufficient documentation

## 2020-10-04 LAB — POC INFLUENZA A AND B ANTIGEN (URGENT CARE ONLY)
INFLUENZA A ANTIGEN, POC: NEGATIVE
INFLUENZA B ANTIGEN, POC: NEGATIVE

## 2020-10-04 LAB — HEPATITIS PANEL, ACUTE
HCV Ab: NONREACTIVE
Hep A IgM: NONREACTIVE
Hep B C IgM: NONREACTIVE
Hepatitis B Surface Ag: NONREACTIVE

## 2020-10-04 LAB — SARS CORONAVIRUS 2 (TAT 6-24 HRS): SARS Coronavirus 2: NEGATIVE

## 2020-10-04 LAB — HIV ANTIBODY (ROUTINE TESTING W REFLEX): HIV Screen 4th Generation wRfx: NONREACTIVE

## 2020-10-04 LAB — POCT RAPID STREP A, ED / UC: Streptococcus, Group A Screen (Direct): NEGATIVE

## 2020-10-04 MED ORDER — ACETAMINOPHEN 160 MG/5ML PO SOLN
ORAL | Status: AC
  Filled 2020-10-04: qty 20.3

## 2020-10-04 MED ORDER — ACETAMINOPHEN 325 MG PO TABS
650.0000 mg | ORAL_TABLET | Freq: Once | ORAL | Status: AC
  Administered 2020-10-04: 650 mg via ORAL

## 2020-10-04 NOTE — ED Provider Notes (Signed)
[facility]    CSN: [id number] Arrival date & time: 10/04/20  1108      History   Chief Complaint Chief Complaint  Patient presents with   Sore Throat    HPI Jose Williams is a 25 y.o. male.   Patient here today for evaluation of sore throat, low-grade fever that started 3 days ago.  He reports that he has not had any ear pain, significant cough, nausea, vomiting, or diarrhea.  He denies any known sick contacts.  He has not reported any treatment for symptoms.  He would like STD screening today as well if possible.  He does not report any concerning symptoms for same.  The history is provided by the patient.  Sore Throat Pertinent negatives include no shortness of breath.   Past Medical History:  Diagnosis Date   Allergic rhinitis 04/25/2014   Bone lesion 04/25/2014   Obesity 04/25/2014   Sickle cell trait (HCC) 04/25/2014    Patient Active Problem List   Diagnosis Date Noted   Sinusitis, chronic 01/09/2015   Chronic tension headaches 01/09/2015   Sickle cell trait (HCC) 04/25/2014   Obesity 04/25/2014   Bone lesion 04/25/2014    History reviewed. No pertinent surgical history.     Home Medications    Prior to Admission medications   Medication Sig Start Date End Date Taking? Authorizing Provider  ibuprofen (ADVIL,MOTRIN) 800 MG tablet Take 1 tablet (800 mg total) by mouth 3 (three) times daily. 04/09/18   Eustace Moore, MD  tiZANidine (ZANAFLEX) 4 MG tablet Take 1-2 tablets (4-8 mg total) by mouth every 6 (six) hours as needed for muscle spasms. 04/09/18   Eustace Moore, MD    Family History Family History  Family history unknown: Yes    Social History Social History   Tobacco Use   Smoking status: Never   Smokeless tobacco: Never  Vaping Use   Vaping Use: Never used  Substance Use Topics   Alcohol use: No    Alcohol/week: 0.0 standard drinks   Drug use: No     Allergies   Patient has no known allergies.   Review of  Systems Review of Systems  Constitutional:  Positive for fever. Negative for chills.  HENT:  Positive for sore throat. Negative for congestion.   Eyes:  Negative for discharge and redness.  Respiratory:  Negative for cough and shortness of breath.   Gastrointestinal:  Negative for diarrhea, nausea and vomiting.    Physical Exam Triage Vital Signs ED Triage Vitals  Enc Vitals Group     BP 10/04/20 1204 136/85     Pulse Rate 10/04/20 1204 89     Resp 10/04/20 1204 18     Temp 10/04/20 1204 (!) 100.5 F (38.1 C)     Temp Source 10/04/20 1204 Oral     SpO2 10/04/20 1204 97 %     Weight --      Height --      Head Circumference --      Peak Flow --      Pain Score 10/04/20 1207 8     Pain Loc --      Pain Edu? --      Excl. in GC? --    No data found.  Updated Vital Signs BP 136/85 (BP Location: Left Arm)   Pulse 89   Temp (!) 100.5 F (38.1 C) (Oral)   Resp 18   SpO2 97%     Physical Exam  Vitals and nursing note reviewed.  Constitutional:      General: He is not in acute distress.    Appearance: Normal appearance. He is not ill-appearing.  HENT:     Head: Normocephalic and atraumatic.     Ears:     Comments: Unable to visualize TMs due to cerumen in bilateral EACs    Nose: Nose normal. No congestion.     Mouth/Throat:     Mouth: Mucous membranes are moist.     Pharynx: Oropharynx is clear. No oropharyngeal exudate or posterior oropharyngeal erythema.  Eyes:     Conjunctiva/sclera: Conjunctivae normal.  Cardiovascular:     Rate and Rhythm: Normal rate and regular rhythm.     Heart sounds: Normal heart sounds. No murmur heard. Pulmonary:     Effort: Pulmonary effort is normal. No respiratory distress.     Breath sounds: Normal breath sounds. No wheezing, rhonchi or rales.  Skin:    General: Skin is warm and dry.  Neurological:     Mental Status: He is alert.  Psychiatric:        Mood and Affect: Mood normal.        Thought Content: Thought content normal.      UC Treatments / Results  Labs (all labs ordered are listed, but only abnormal results are displayed) Labs Reviewed  SARS CORONAVIRUS 2 (TAT 6-24 HRS)  HIV ANTIBODY (ROUTINE TESTING W REFLEX)  HEPATITIS PANEL, ACUTE  RPR  POCT RAPID STREP A, ED / UC  POC INFLUENZA A AND B ANTIGEN (URGENT CARE ONLY)  CYTOLOGY, (ORAL, ANAL, URETHRAL) ANCILLARY ONLY    EKG   Radiology No results found.  Procedures Procedures (including critical care time)  Medications Ordered in UC Medications  acetaminophen (TYLENOL) tablet 650 mg (650 mg Oral Given 10/04/20 1214)    Initial Impression / Assessment and Plan / UC Course  I have reviewed the triage vital signs and the nursing notes.  Pertinent labs & imaging results that were available during my care of the patient were reviewed by me and considered in my medical decision making (see chart for details).  Strep test and flu test negative in office.  Will order COVID screening as well as throat culture for further evaluation.  STD screening ordered as requested.  Recommend follow-up with any further concerns.  Final Clinical Impressions(s) / UC Diagnoses   Final diagnoses:  Pharyngitis, unspecified etiology  Screening for STD (sexually transmitted disease)     Discharge Instructions      Check MyChart for results. We will call and treat any positive findings. Follow up with any further concerns.      ED Prescriptions   None    PDMP not reviewed this encounter.   Tomi Bamberger, PA-C 10/04/20 1343

## 2020-10-04 NOTE — Discharge Instructions (Signed)
Check MyChart for results. We will call and treat any positive findings. Follow up with any further concerns.

## 2020-10-04 NOTE — ED Triage Notes (Signed)
Pt presents with sore throat X 3 days.  

## 2020-10-05 LAB — CYTOLOGY, (ORAL, ANAL, URETHRAL) ANCILLARY ONLY
Chlamydia: NEGATIVE
Comment: NEGATIVE
Comment: NORMAL
Neisseria Gonorrhea: NEGATIVE

## 2020-10-05 LAB — RPR: RPR Ser Ql: NONREACTIVE

## 2020-10-06 ENCOUNTER — Encounter (HOSPITAL_COMMUNITY): Payer: Self-pay

## 2020-10-06 ENCOUNTER — Emergency Department (HOSPITAL_COMMUNITY)
Admission: EM | Admit: 2020-10-06 | Discharge: 2020-10-07 | Disposition: A | Discharge status: Home / Self Care | Payer: Self-pay | Attending: Emergency Medicine | Admitting: Emergency Medicine

## 2020-10-06 DIAGNOSIS — J039 Acute tonsillitis, unspecified: Secondary | ICD-10-CM | POA: Insufficient documentation

## 2020-10-06 LAB — BASIC METABOLIC PANEL
Anion gap: 16 — ABNORMAL HIGH (ref 5–15)
BUN: 7 mg/dL (ref 6–20)
CO2: 23 mmol/L (ref 22–32)
Calcium: 9.4 mg/dL (ref 8.9–10.3)
Chloride: 100 mmol/L (ref 98–111)
Creatinine, Ser: 1.2 mg/dL (ref 0.61–1.24)
GFR, Estimated: >60 mL/min (ref >60)
Glucose, Bld: 109 mg/dL — ABNORMAL HIGH (ref 70–99)
Potassium: 3.3 mmol/L — ABNORMAL LOW (ref 3.5–5.1)
Sodium: 139 mmol/L (ref 135–145)

## 2020-10-06 LAB — CBC WITH DIFFERENTIAL/PLATELET
Abs Immature Granulocytes: 0.09 10*3/uL — ABNORMAL HIGH (ref 0.00–0.07)
Basophils Absolute: 0 10*3/uL (ref 0.0–0.1)
Basophils Relative: 1 %
Eosinophils Absolute: 0 10*3/uL (ref 0.0–0.5)
Eosinophils Relative: 0 %
HCT: 46.9 % (ref 39.0–52.0)
Hemoglobin: 15.7 g/dL (ref 13.0–17.0)
Immature Granulocytes: 1 %
Lymphocytes Relative: 16 %
Lymphs Abs: 1.4 10*3/uL (ref 0.7–4.0)
MCH: 27.6 pg (ref 26.0–34.0)
MCHC: 33.5 g/dL (ref 30.0–36.0)
MCV: 82.6 fL (ref 80.0–100.0)
Monocytes Absolute: 1.3 10*3/uL — ABNORMAL HIGH (ref 0.1–1.0)
Monocytes Relative: 15 %
Neutro Abs: 5.8 10*3/uL (ref 1.7–7.7)
Neutrophils Relative %: 67 %
Platelets: 241 10*3/uL (ref 150–400)
RBC: 5.68 MIL/uL (ref 4.22–5.81)
RDW: 12.3 % (ref 11.5–15.5)
WBC: 8.7 10*3/uL (ref 4.0–10.5)
nRBC: 0 % (ref 0.0–0.2)

## 2020-10-06 MED ORDER — ONDANSETRON HCL 4 MG/2ML IJ SOLN
4.0000 mg | Freq: Once | INTRAMUSCULAR | Status: AC
  Administered 2020-10-06: 4 mg via INTRAVENOUS
  Filled 2020-10-06: qty 2

## 2020-10-06 MED ORDER — ACETAMINOPHEN 500 MG PO TABS
1000.0000 mg | ORAL_TABLET | Freq: Once | ORAL | Status: AC
  Administered 2020-10-06: 1000 mg via ORAL
  Filled 2020-10-06: qty 2

## 2020-10-06 MED ORDER — DEXAMETHASONE SODIUM PHOSPHATE 10 MG/ML IJ SOLN
10.0000 mg | Freq: Once | INTRAMUSCULAR | Status: AC
  Administered 2020-10-06: 10 mg via INTRAVENOUS
  Filled 2020-10-06: qty 1

## 2020-10-06 NOTE — ED Triage Notes (Signed)
Pt states that he has has a sore throat for several days, went to UC on Sunday and was told it was not strep, tonsils are very red and swollen, pt is hoarse and having difficulty swallowing.

## 2020-10-06 NOTE — ED Provider Notes (Signed)
Emergency Medicine Provider Triage Evaluation Note  Jose Williams , a 25 y.o. male  was evaluated in triage.  Pt complains of sore throat.  Seen at [facility] 10/04/20 and had strep, flu, and covid testing that was negative.  Symptoms worsening, now having trouble swallowing.  Review of Systems  Positive: Sore throat, trouble swallowing Negative: SOB  Physical Exam  BP (!) 157/69   Pulse (!) 122   Temp (!) 100.7 F (38.2 C) (Oral)   Resp 16   SpO2 97%  Gen:   Awake, no distress   Resp:  Normal effort  MSK:   Moves extremities without difficulty  Other:  Tonsils 2+ bilaterally, very erythematous with exudates, slight asymmetry (right > left) but no frank uvular deviation, hot potato voice, no drooling or stridor, maintaining airway  Medical Decision Making  Medically screening exam initiated at 10:12 PM.  Appropriate orders placed.  [name] was informed that the remainder of the evaluation will be completed by another provider, this initial triage assessment does not replace that evaluation, and the importance of remaining in the ED until their evaluation is complete.  Febrile in triage with obvious tonsillar edema and exudates.  Slight asymmetry noted with audible hot potato voice and subjective trouble swallowing .  No drooling or stridor, maintaining airway. Concern for PTA.    Labs ordered, IV placed and given decadron.  Will get CTA soft tissue neck.   Garlon Hatchet, PA-C 10/06/20 2220    Vanetta Mulders, MD 10/13/20 1531

## 2020-10-07 ENCOUNTER — Emergency Department (HOSPITAL_COMMUNITY): Payer: Self-pay

## 2020-10-07 MED ORDER — AMOXICILLIN-POT CLAVULANATE 875-125 MG PO TABS: 1.0000 | ORAL_TABLET | Freq: Two times a day (BID) | ORAL | 0 refills | Status: DC

## 2020-10-07 MED ORDER — IOHEXOL 350 MG/ML SOLN
80.0000 mL | Freq: Once | INTRAVENOUS | Status: AC | PRN
  Administered 2020-10-07: 80 mL via INTRAVENOUS

## 2020-10-07 MED ORDER — CLINDAMYCIN HCL 300 MG PO CAPS
300.0000 mg | ORAL_CAPSULE | Freq: Once | ORAL | Status: DC
  Filled 2020-10-07: qty 1

## 2020-10-07 MED ORDER — AMOXICILLIN-POT CLAVULANATE 875-125 MG PO TABS
1.0000 | ORAL_TABLET | Freq: Once | ORAL | Status: AC
  Administered 2020-10-07: 1 via ORAL
  Filled 2020-10-07: qty 1

## 2020-10-07 NOTE — Discharge Instructions (Addendum)
You have tonsillitis. Take augmentin twice daily for a week   Call ENT for follow-up if you have persistent sore throat or trouble swallowing.  Return to ER if you cannot swallow, trouble breathing

## 2020-10-07 NOTE — ED Provider Notes (Signed)
[facility] EMERGENCY DEPARTMENT Provider Note   CSN: [id number] Arrival date & time: 10/06/20  2204     History Chief Complaint  Patient presents with  . Sore Throat    Jose Williams is a 25 y.o. male here presenting with sore throat and trouble swallowing.  Patient has been having sore throat for several days now.  Went to urgent care on 10/2 and had negative strep and flu and COVID.  Patient now has some trouble swallowing.  At triage, patient was noted to have tonsillar exudates that is worse on the right side.  CT neck was ordered in triage.  Patient was also given Decadron and now feels slightly better.  The history is provided by the patient.      Past Medical History:  Diagnosis Date  . Allergic rhinitis 04/25/2014  . Bone lesion 04/25/2014  . Obesity 04/25/2014  . Sickle cell trait (HCC) 04/25/2014    Patient Active Problem List   Diagnosis Date Noted  . Sinusitis, chronic 01/09/2015  . Chronic tension headaches 01/09/2015  . Sickle cell trait (HCC) 04/25/2014  . Obesity 04/25/2014  . Bone lesion 04/25/2014    History reviewed. No pertinent surgical history.     Family History  Family history unknown: Yes    Social History   Tobacco Use  . Smoking status: Never  . Smokeless tobacco: Never  Vaping Use  . Vaping Use: Never used  Substance Use Topics  . Alcohol use: No    Alcohol/week: 0.0 standard drinks  . Drug use: No    Home Medications Prior to Admission medications   Medication Sig Start Date End Date Taking? Authorizing Provider  amoxicillin-clavulanate (AUGMENTIN) 875-125 MG tablet Take 1 tablet by mouth every 12 (twelve) hours. 10/07/20  Yes Charlynne Pander, MD  ibuprofen (ADVIL,MOTRIN) 800 MG tablet Take 1 tablet (800 mg total) by mouth 3 (three) times daily. 04/09/18   Eustace Moore, MD  tiZANidine (ZANAFLEX) 4 MG tablet Take 1-2 tablets (4-8 mg total) by mouth every 6 (six) hours as needed for muscle spasms.  04/09/18   Eustace Moore, MD    Allergies    Patient has no known allergies.  Review of Systems   Review of Systems  HENT:  Positive for sore throat and trouble swallowing.   All other systems reviewed and are negative.  Physical Exam Updated Vital Signs BP (!) 150/86 (BP Location: Left Arm)   Pulse 85   Temp 98.9 F (37.2 C) (Oral)   Resp 12   SpO2 99%   Physical Exam Vitals and nursing note reviewed.  Constitutional:      Comments: Slightly uncomfortable, tolerating secretions.  Patient does have a hot potato voice  HENT:     Head: Normocephalic.     Mouth/Throat:     Comments: Bilateral tonsillar exudates worse on the right side.  Patient's uvula is still midline. Neck:     Comments: Cervical lymphadenopathy on the right side. Cardiovascular:     Rate and Rhythm: Normal rate and regular rhythm.     Heart sounds: Normal heart sounds.  Pulmonary:     Effort: Pulmonary effort is normal.     Breath sounds: Normal breath sounds.  Abdominal:     General: Bowel sounds are normal.     Palpations: Abdomen is soft.  Musculoskeletal:     Cervical back: Normal range of motion.  Skin:    General: Skin is warm.     Capillary  Refill: Capillary refill takes less than 2 seconds.  Neurological:     General: No focal deficit present.  Psychiatric:        Mood and Affect: Mood normal.    ED Results / Procedures / Treatments   Labs (all labs ordered are listed, but only abnormal results are displayed) Labs Reviewed  CBC WITH DIFFERENTIAL/PLATELET - Abnormal; Notable for the following components:      Result Value   Monocytes Absolute 1.3 (*)    Abs Immature Granulocytes 0.09 (*)    All other components within normal limits  BASIC METABOLIC PANEL - Abnormal; Notable for the following components:   Potassium 3.3 (*)    Glucose, Bld 109 (*)    Anion gap 16 (*)    All other components within normal limits    EKG None  Radiology CT Soft Tissue Neck W  Contrast  Result Date: 10/07/2020 CLINICAL DATA:  Initial evaluation for acute sore throat for 1 week. EXAM: CT NECK WITH CONTRAST TECHNIQUE: Multidetector CT imaging of the neck was performed using the standard protocol following the bolus administration of intravenous contrast. CONTRAST:  61mL OMNIPAQUE IOHEXOL 350 MG/ML SOLN COMPARISON:  None. FINDINGS: Pharynx and larynx: Oral cavity within normal limits. Palatine tonsils are enlarged and hyperenhancing bilaterally, consistent with acute tonsillitis. No discrete tonsillar or peritonsillar abscess. Adenoidal soft tissues prominent and hypertrophied as well. No retropharyngeal collection. Epiglottis is normal without evidence for acute epiglottitis. Vallecula clear. Layering secretions noted within the hypopharynx. Hypopharynx and supraglottic larynx otherwise unremarkable. Glottis normal. Subglottic airway patent clear. Salivary glands: Salivary glands including the parotid and submandibular glands are normal. Thyroid: Normal. Lymph nodes: Enlarged bilateral level II lymph nodes measure up to 1.5 cm on the left and 1.4 cm on the right. Prominent retropharyngeal lymph nodes measure up to 1.1 cm. No other enlarged or pathologic adenopathy. Vascular: Normal intravascular enhancement seen throughout the neck. Limited intracranial: Unremarkable. Visualized orbits: Unremarkable. Mastoids and visualized paranasal sinuses: Moderate opacity noted within the left maxillary sinus. Relatively mild mucosal thickening noted within the contralateral right maxillary sinus. Mastoid air cells and middle ear cavities are well pneumatized and free of fluid. Skeleton: No discrete or worrisome osseous lesions. Upper chest: Visualized upper chest demonstrates no acute finding. Other: None. IMPRESSION: 1. Findings consistent with acute tonsillitis. No discrete tonsillar or peritonsillar abscess. 2. Enlarged bilateral level II and retropharyngeal adenopathy, likely reactive. 3. Left  maxillary sinusitis. Electronically Signed   By: Rise Mu M.D.   On: 10/07/2020 00:58    Procedures Procedures   Medications Ordered in ED Medications  dexamethasone (DECADRON) injection 10 mg (10 mg Intravenous Given 10/06/20 2223)  acetaminophen (TYLENOL) tablet 1,000 mg (1,000 mg Oral Given 10/06/20 2223)  ondansetron (ZOFRAN) injection 4 mg (4 mg Intravenous Given 10/06/20 2234)  iohexol (OMNIPAQUE) 350 MG/ML injection 80 mL (80 mLs Intravenous Contrast Given 10/07/20 0021)  amoxicillin-clavulanate (AUGMENTIN) 875-125 MG per tablet 1 tablet (1 tablet Oral Given 10/07/20 0329)    ED Course  I have reviewed the triage vital signs and the nursing notes.  Pertinent labs & imaging results that were available during my care of the patient were reviewed by me and considered in my medical decision making (see chart for details).    MDM Rules/Calculators/A&P                           Jose Williams is a 25 y.o. male here presenting with sore  throat and swollen tonsils. Initial concern for possible peritonsillar abscess so CT was ordered in triage.  CT did not show any peritonsillar abscess or retropharyngeal abscess. Patient has tonsillitis on CT. White blood cell count is normal.  Patient is tolerating secretions.  Will discharge patient home with Augmentin and ENT follow-up.  Gave strict return precautions  Final Clinical Impression(s) / ED Diagnoses Final diagnoses:  Tonsillitis    Rx / DC Orders ED Discharge Orders          Ordered    amoxicillin-clavulanate (AUGMENTIN) 875-125 MG tablet  Every 12 hours        10/07/20 0330             Charlynne Pander, MD 10/07/20 [phone]

## 2020-10-08 LAB — CULTURE, GROUP A STREP (THRC)

## 2021-05-21 NOTE — Progress Notes (Signed)
Subjective:    Patient ID: Jose Williams, male    DOB: 01-Feb-1995, 26 y.o.   MRN: [id number]  CC: Establish care  HPI:  Jose Williams is a very pleasant 26 y.o. male who presents today to establish care.  Initial concerns: Left knee pain- states that he has a history of a benign cyst that was removed.  Back in 2016 he hit his left knee in a pool and it felt like it was out of place but x-rays at the time appeared normal.  Since then, it occasionally feels like his knee "needs to pop".  He reports pain from his thigh to his ankle.  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  His pain seems worse at work.  He does not routinely take any medications for this but he did take ibuprofen last week.  He will occasionally prop his leg up and stretch.  He has not tried PT in the past.  Past medical history: Sickle cell trait  Past surgical history: None  Current medications: None  Family history:  Mothers side with DM, kidney failure.  Maternal grandmother passed away from colon cancer he believes, diagnosed around the age of 48.   Social history: Lives at home with mother, sister and two pitbulls. Works in [employer]. No alcohol, drugs or tobacco. Likes to go out for fun.   ROS: pertinent noted in the HPI   Objective:  BP 128/88   Pulse 77   Wt 264 lb (119.7 kg)   SpO2 100%   BMI 35.80 kg/m    Vitals and nursing note reviewed  General: NAD, pleasant, able to participate in exam Cardiac: RRR, S1 S2 present. normal heart sounds, no murmurs. Respiratory: CTAB, normal effort, No wheezes, rales or rhonchi Abdomen: Obese abdomen Knee, Left: TTP noted at the patellar tendon. Inspection was negative for erythema, ecchymosis, and effusion. No obvious bony abnormalities or signs of osteophyte development. Palpation yielded no asymmetric warmth; No joint line tenderness; No condyle tenderness; No patellar tenderness; No patellar crepitus. Patellar and quadriceps tendons  unremarkable, and no tenderness of the pes anserine bursa. No obvious Baker's cyst development. ROM normal in flexion (135 degrees) and extension (0 degrees). Normal hamstring and quadriceps strength. Neurovascularly intact bilaterally.  - Ligaments: (Solid and consistent endpoints)   - ACL (present bilaterally)   - PCL (present bilaterally)   - LCL (present bilaterally)   - MCL (present bilaterally).   - Additional tests performed:    - Anterior Drawer >> NEG  - Patella:   - Patellar grind/compression: NE   - Patellar glide: Without apprehension Skin: warm and dry, no rashes noted Neuro: alert, no obvious focal deficits Psych: Normal affect and mood   Assessment & Plan:    Encounter for HIV screening and discussion of pre-exposure prophylaxis for HIV Patient interested in starting PrEP.  -Obtaining initial lab work today -Will prescribe if lab work appropriate -Advised to return in 3 months for f/u lab work and refills  Knee pain Chronic.  Exam today notable for tenderness to the patellar tendon.  No recent inciting injury.  Good ligamentous stability and range of motion.  We will hold off on imaging at this time. -Recommend patellar brace -Can use ibuprofen as needed for pain -Provided with the exercises and discussed importance of strengthening quads -Consider PT if no improvement  Lytic bone lesions on xray Per chart review, appears that he had a lytic lesion in the cortex of the distal  tibia dating back to 2013.  His most recent left leg x-ray in 03/2014 revealed that the lesion was slightly enlarged compared to 2014.  The differentials include at the time included chondromyxoid fibroma or fibrous dysplasia.  He was recommended to follow-up with orthopedics but did not.   -Ambulatory referral to Orthopedics placed today   BMI 35.0-35.9,adult Screening for diabetes today returned normal with A1c 5.2. Will check lipid panel. Encourage exercise and dietary changes to help with  weight loss over the long-term. Can discuss more in detail at next visit.  Screening examination for sexually transmitted disease Urine cytology as well as oral and anal swabs were collected today. Blood testing for HIV, RPR, and Hep B also tested. Will f/u with results and treat as indicated.    Sabino Dick, DO Family Medicine Resident

## 2021-05-26 ENCOUNTER — Other Ambulatory Visit: Payer: Self-pay

## 2021-05-26 ENCOUNTER — Encounter: Payer: Self-pay | Admitting: Family Medicine

## 2021-05-26 ENCOUNTER — Other Ambulatory Visit (HOSPITAL_COMMUNITY)
Admission: RE | Admit: 2021-05-26 | Discharge: 2021-05-26 | Disposition: A | Discharge status: Home / Self Care | Payer: Commercial Managed Care - HMO | Source: Ambulatory Visit | Attending: Family Medicine | Admitting: Family Medicine

## 2021-05-26 ENCOUNTER — Ambulatory Visit (INDEPENDENT_AMBULATORY_CARE_PROVIDER_SITE_OTHER): Payer: Commercial Managed Care - HMO | Admitting: Family Medicine

## 2021-05-26 VITALS — BP 128/88 | HR 77 | Wt 264.0 lb

## 2021-05-26 DIAGNOSIS — Z7689 Persons encountering health services in other specified circumstances: Secondary | ICD-10-CM

## 2021-05-26 DIAGNOSIS — Z114 Encounter for screening for human immunodeficiency virus [HIV]: Secondary | ICD-10-CM | POA: Insufficient documentation

## 2021-05-26 DIAGNOSIS — M25569 Pain in unspecified knee: Secondary | ICD-10-CM | POA: Insufficient documentation

## 2021-05-26 DIAGNOSIS — M899 Disorder of bone, unspecified: Secondary | ICD-10-CM

## 2021-05-26 DIAGNOSIS — Z6835 Body mass index (BMI) 35.0-35.9, adult: Secondary | ICD-10-CM | POA: Diagnosis not present

## 2021-05-26 DIAGNOSIS — Z113 Encounter for screening for infections with a predominantly sexual mode of transmission: Secondary | ICD-10-CM | POA: Diagnosis not present

## 2021-05-26 DIAGNOSIS — G8929 Other chronic pain: Secondary | ICD-10-CM

## 2021-05-26 DIAGNOSIS — M898X9 Other specified disorders of bone, unspecified site: Secondary | ICD-10-CM

## 2021-05-26 DIAGNOSIS — Z7189 Other specified counseling: Secondary | ICD-10-CM

## 2021-05-26 DIAGNOSIS — M25562 Pain in left knee: Secondary | ICD-10-CM

## 2021-05-26 LAB — POCT URINALYSIS DIP (MANUAL ENTRY)
Bilirubin, UA: NEGATIVE
Blood, UA: NEGATIVE
Glucose, UA: NEGATIVE mg/dL
Ketones, POC UA: NEGATIVE mg/dL
Leukocytes, UA: NEGATIVE
Nitrite, UA: NEGATIVE
Protein Ur, POC: NEGATIVE mg/dL
Spec Grav, UA: 1.02 (ref 1.010–1.025)
Urobilinogen, UA: 0.2 E.U./dL
pH, UA: 5.5 (ref 5.0–8.0)

## 2021-05-26 LAB — POCT GLYCOSYLATED HEMOGLOBIN (HGB A1C): Hemoglobin A1C: 5.2 % (ref 4.0–5.6)

## 2021-05-26 NOTE — Assessment & Plan Note (Signed)
Screening for diabetes today returned normal with A1c 5.2. Will check lipid panel. Encourage exercise and dietary changes to help with weight loss over the long-term. Can discuss more in detail at next visit.

## 2021-05-26 NOTE — Assessment & Plan Note (Signed)
Patient interested in starting PrEP.  -Obtaining initial lab work today -Will prescribe if lab work appropriate -Advised to return in 3 months for f/u lab work and refills

## 2021-05-26 NOTE — Assessment & Plan Note (Signed)
Per chart review, appears that he had a lytic lesion in the cortex of the distal tibia dating back to 2013.  His most recent left leg x-ray in 03/2014 revealed that the lesion was slightly enlarged compared to 2014.  The differentials include at the time included chondromyxoid fibroma or fibrous dysplasia.  He was recommended to follow-up with orthopedics but did not.   -Ambulatory referral to Orthopedics placed today

## 2021-05-26 NOTE — Assessment & Plan Note (Signed)
Chronic.  Exam today notable for tenderness to the patellar tendon.  No recent inciting injury.  Good ligamentous stability and range of motion.  We will hold off on imaging at this time. -Recommend patellar brace -Can use ibuprofen as needed for pain -Provided with the exercises and discussed importance of strengthening quads -Consider PT if no improvement

## 2021-05-26 NOTE — Patient Instructions (Addendum)
It was wonderful to see you today.  Please bring ALL of your medications with you to every visit.   Today we talked about:  -We are doing lab work today, I'll let you know the results and prescribe you PrEP if everything comes back good! -Try to buy a Patellar strap. Wear this at work. You can use Ibuprofen as needed for pain control. I am also going to attach knee exercises to help you.   -   Thank you for choosing [facility].   Please call [phone] with any questions about today's appointment.  Please be sure to schedule follow up at the front  desk before you leave today.   Sabino DickAlejandra Tawana Pasch, DO PGY-2 Family Medicine   Knee Exercises Ask your health care provider which exercises are safe for you. Do exercises exactly as told by your health care provider and adjust them as directed. It is normal to feel mild stretching, pulling, tightness, or discomfort as you do these exercises. Stop right away if you feel sudden pain or your pain gets worse. Do not begin these exercises until told by your health care provider. Stretching and range-of-motion exercises These exercises warm up your muscles and joints and improve the movement and flexibility of your knee. These exercises also help to relieve pain and swelling. Knee extension, prone  Lie on your abdomen (prone position) on a bed. Place your left / right knee just beyond the edge of the surface so your knee is not on the bed. You can put a towel under your left / right thigh just above your kneecap for comfort. Relax your leg muscles and allow gravity to straighten your knee (extension). You should feel a stretch behind your left / right knee. Hold this position for __________ seconds. Scoot up so your knee is supported between repetitions. Repeat __________ times. Complete this exercise __________ times a day. Knee flexion, active  Lie on your back with both legs straight. If this causes back discomfort, bend  your left / right knee so your foot is flat on the floor. Slowly slide your left / right heel back toward your buttocks. Stop when you feel a gentle stretch in the front of your knee or thigh (flexion). Hold this position for __________ seconds. Slowly slide your left / right heel back to the starting position. Repeat __________ times. Complete this exercise __________ times a day. Quadriceps stretch, prone  Lie on your abdomen on a firm surface, such as a bed or padded floor. Bend your left / right knee and hold your ankle. If you cannot reach your ankle or pant leg, loop a belt around your foot and grab the belt instead. Gently pull your heel toward your buttocks. Your knee should not slide out to the side. You should feel a stretch in the front of your thigh and knee (quadriceps). Hold this position for __________ seconds. Repeat __________ times. Complete this exercise __________ times a day. Hamstring, supine  Lie on your back (supine position). Loop a belt or towel over the ball of your left / right foot. The ball of your foot is on the walking surface, right under your toes. Straighten your left / right knee and slowly pull on the belt to raise your leg until you feel a gentle stretch behind your knee (hamstring). Do not let your knee bend while you do this. Keep your other leg flat on the floor. Hold this position for __________ seconds. Repeat __________ times. Complete this  exercise __________ times a day. Strengthening exercises These exercises build strength and endurance in your knee. Endurance is the ability to use your muscles for a long time, even after they get tired. Quadriceps, isometric This exercise strengthens the muscles in front of your thigh (quadriceps) without moving your knee joint (isometric). Lie on your back with your left / right leg extended and your other knee bent. Put a rolled towel or small pillow under your knee if told by your health care  provider. Slowly tense the muscles in the front of your left / right thigh. You should see your kneecap slide up toward your hip or see increased dimpling just above the knee. This motion will push the back of the knee toward the floor. For __________ seconds, hold the muscle as tight as you can without increasing your pain. Relax the muscles slowly and completely. Repeat __________ times. Complete this exercise __________ times a day. Straight leg raises This exercise strengthens the muscles in front of your thigh (quadriceps) and the muscles that move your hips (hip flexors). Lie on your back with your left / right leg extended and your other knee bent. Tense the muscles in the front of your left / right thigh. You should see your kneecap slide up or see increased dimpling just above the knee. Your thigh may even shake a bit. Keep these muscles tight as you raise your leg 4-6 inches (10-15 cm) off the floor. Do not let your knee bend. Hold this position for __________ seconds. Keep these muscles tense as you lower your leg. Relax your muscles slowly and completely after each repetition. Repeat __________ times. Complete this exercise __________ times a day. Hamstring, isometric  Lie on your back on a firm surface. Bend your left / right knee about __________ degrees. Dig your left / right heel into the surface as if you are trying to pull it toward your buttocks. Tighten the muscles in the back of your thighs (hamstring) to "dig" as hard as you can without increasing any pain. Hold this position for __________ seconds. Release the tension gradually and allow your muscles to relax completely for __________ seconds after each repetition. Repeat __________ times. Complete this exercise __________ times a day. Hamstring curls If told by your health care provider, do this exercise while wearing ankle weights. Begin with __________lb / kg weights. Then increase the weight by 1 lb (0.5 kg)  increments. Do not wear ankle weights that are more than __________lb / kg. Lie on your abdomen with your legs straight. Bend your left / right knee as far as you can without feeling pain. Keep your hips flat against the floor. Hold this position for __________ seconds. Slowly lower your leg to the starting position. Repeat __________ times. Complete this exercise __________ times a day. Squats This exercise strengthens the muscles in front of your thigh and knee (quadriceps). Stand in front of a table, with your feet and knees pointing straight ahead. You may rest your hands on the table for balance but not for support. Slowly bend your knees and lower your hips like you are going to sit in a chair. Keep your weight over your heels, not over your toes. Keep your lower legs upright so they are parallel with the table legs. Do not let your hips go lower than your knees. Do not bend lower than told by your health care provider. If your knee pain increases, do not bend as low. Hold the squat position for __________  seconds. Slowly push with your legs to return to standing. Do not use your hands to pull yourself to standing. Repeat __________ times. Complete this exercise __________ times a day. Wall slides This exercise strengthens the muscles in front of your thigh and knee (quadriceps). Lean your back against a smooth wall or door, and walk your feet out 18-24 inches (46-61 cm) from it. Place your feet hip-width apart. Slowly slide down the wall or door until your knees bend __________ degrees. Keep your knees over your heels, not over your toes. Keep your knees in line with your hips. Hold this position for __________ seconds. Repeat __________ times. Complete this exercise __________ times a day. Straight leg raises, side-lying This exercise strengthens the muscles that rotate the leg at the hip and move it away from your body (hip abductors). Lie on your side with your left / right leg  in the top position. Lie so your head, shoulder, knee, and hip line up. You may bend your bottom knee to help you keep your balance. Roll your hips slightly forward so your hips are stacked directly over each other and your left / right knee is facing forward. Leading with your heel, lift your top leg 4-6 inches (10-15 cm). You should feel the muscles in your outer hip lifting. Do not let your foot drift forward. Do not let your knee roll toward the ceiling. Hold this position for __________ seconds. Slowly return your leg to the starting position. Let your muscles relax completely after each repetition. Repeat __________ times. Complete this exercise __________ times a day. Straight leg raises, prone This exercise stretches the muscles that move your hips away from the front of the pelvis (hip extensors). Lie on your abdomen on a firm surface. You can put a pillow under your hips if that is more comfortable. Tense the muscles in your buttocks and lift your left / right leg about 4-6 inches (10-15 cm). Keep your knee straight as you lift your leg. Hold this position for __________ seconds. Slowly lower your leg to the starting position. Let your leg relax completely after each repetition. Repeat __________ times. Complete this exercise __________ times a day. This information is not intended to replace advice given to you by your health care provider. Make sure you discuss any questions you have with your health care provider. Document Revised: 09/01/2020 Document Reviewed: 09/01/2020 Elsevier Patient Education  2023 [employer].   We talked about measures of prevention today. PREP (pre-exposure prophylaxis) is a daily medication to prevent HIV (Human Immunodeficiency Virus).   Below is some information about the medication--most commonly called Truvada. This is a once daily pill.

## 2021-05-26 NOTE — Assessment & Plan Note (Signed)
Urine cytology as well as oral and anal swabs were collected today. Blood testing for HIV, RPR, and Hep B also tested. Will f/u with results and treat as indicated.

## 2021-05-27 LAB — CERVICOVAGINAL ANCILLARY ONLY
Chlamydia: NEGATIVE
Comment: NEGATIVE
Comment: NEGATIVE
Comment: NORMAL
Neisseria Gonorrhea: NEGATIVE
Trichomonas: NEGATIVE

## 2021-05-27 LAB — COMPREHENSIVE METABOLIC PANEL
ALT: 20 IU/L (ref 0–44)
AST: 19 IU/L (ref 0–40)
Albumin/Globulin Ratio: 1.6 (ref 1.2–2.2)
Albumin: 4.7 g/dL (ref 4.1–5.2)
Alkaline Phosphatase: 86 IU/L (ref 44–121)
BUN/Creatinine Ratio: 9 (ref 9–20)
BUN: 9 mg/dL (ref 6–20)
Bilirubin Total: 0.7 mg/dL (ref 0.0–1.2)
CO2: 25 mmol/L (ref 20–29)
Calcium: 9.5 mg/dL (ref 8.7–10.2)
Chloride: 105 mmol/L (ref 96–106)
Creatinine, Ser: 1.01 mg/dL (ref 0.76–1.27)
Globulin, Total: 2.9 g/dL (ref 1.5–4.5)
Glucose: 95 mg/dL (ref 70–99)
Potassium: 4.5 mmol/L (ref 3.5–5.2)
Sodium: 142 mmol/L (ref 134–144)
Total Protein: 7.6 g/dL (ref 6.0–8.5)
eGFR: 106 mL/min/{1.73_m2} (ref >59)

## 2021-05-27 LAB — HCV INTERPRETATION

## 2021-05-27 LAB — HEPATITIS A ANTIBODY, TOTAL: hep A Total Ab: POSITIVE — AB

## 2021-05-27 LAB — HEPATITIS B SURFACE ANTIBODY,QUALITATIVE: Hep B Surface Ab, Qual: REACTIVE

## 2021-05-27 LAB — URINE CYTOLOGY ANCILLARY ONLY
Chlamydia: NEGATIVE
Comment: NEGATIVE
Comment: NORMAL
Neisseria Gonorrhea: NEGATIVE

## 2021-05-27 LAB — RPR: RPR Ser Ql: NONREACTIVE

## 2021-05-27 LAB — HIV ANTIBODY (ROUTINE TESTING W REFLEX): HIV Screen 4th Generation wRfx: NONREACTIVE

## 2021-05-27 LAB — HCV AB W REFLEX TO QUANT PCR: HCV Ab: NONREACTIVE

## 2021-05-27 LAB — HEPATITIS B SURFACE ANTIGEN: Hepatitis B Surface Ag: NEGATIVE

## 2021-05-27 LAB — HEPATITIS B CORE ANTIBODY, TOTAL: Hep B Core Total Ab: NEGATIVE

## 2021-06-01 ENCOUNTER — Other Ambulatory Visit: Payer: Self-pay | Admitting: Family Medicine

## 2021-06-01 ENCOUNTER — Encounter: Payer: Self-pay | Admitting: Family Medicine

## 2021-06-01 MED ORDER — EMTRICITABINE-TENOFOVIR DF 200-300 MG PO TABS: 1.0000 | ORAL_TABLET | Freq: Every day | ORAL | 2 refills | Status: AC

## 2021-06-01 NOTE — Progress Notes (Signed)
Starting Truvada for PrEP

## 2021-06-02 LAB — LIPID PANEL
Chol/HDL Ratio: 5.3 ratio — ABNORMAL HIGH (ref 0.0–5.0)
Cholesterol, Total: 163 mg/dL (ref 100–199)
HDL: 31 mg/dL — ABNORMAL LOW (ref >39)
LDL Chol Calc (NIH): 107 mg/dL — ABNORMAL HIGH (ref 0–99)
Triglycerides: 140 mg/dL (ref 0–149)
VLDL Cholesterol Cal: 25 mg/dL (ref 5–40)

## 2021-06-02 LAB — SPECIMEN STATUS REPORT

## 2021-06-04 LAB — CERVICOVAGINAL ANCILLARY ONLY
Chlamydia: NEGATIVE
Comment: NEGATIVE
Comment: NEGATIVE
Comment: NORMAL
Neisseria Gonorrhea: NEGATIVE
Trichomonas: NEGATIVE

## 2021-06-08 ENCOUNTER — Encounter: Payer: Self-pay | Admitting: *Deleted

## 2021-06-10 ENCOUNTER — Ambulatory Visit: Payer: Commercial Managed Care - HMO | Admitting: Orthopedic Surgery

## 2021-06-14 ENCOUNTER — Ambulatory Visit (HOSPITAL_COMMUNITY)
Admission: EM | Admit: 2021-06-14 | Discharge: 2021-06-14 | Disposition: A | Discharge status: Home / Self Care | Payer: Commercial Managed Care - HMO | Attending: Physician Assistant | Admitting: Physician Assistant

## 2021-06-14 ENCOUNTER — Ambulatory Visit (INDEPENDENT_AMBULATORY_CARE_PROVIDER_SITE_OTHER): Payer: Commercial Managed Care - HMO

## 2021-06-14 ENCOUNTER — Encounter (HOSPITAL_COMMUNITY): Payer: Self-pay

## 2021-06-14 DIAGNOSIS — M545 Low back pain, unspecified: Secondary | ICD-10-CM

## 2021-06-14 LAB — POCT URINALYSIS DIPSTICK, ED / UC
Bilirubin Urine: NEGATIVE
Glucose, UA: NEGATIVE mg/dL
Hgb urine dipstick: NEGATIVE
Ketones, ur: NEGATIVE mg/dL
Leukocytes,Ua: NEGATIVE
Nitrite: NEGATIVE
Protein, ur: NEGATIVE mg/dL
Specific Gravity, Urine: 1.015 (ref 1.005–1.030)
Urobilinogen, UA: 0.2 mg/dL (ref 0.0–1.0)
pH: 5.5 (ref 5.0–8.0)

## 2021-06-14 MED ORDER — METHOCARBAMOL 500 MG PO TABS: 500.0000 mg | ORAL_TABLET | Freq: Three times a day (TID) | ORAL | 0 refills | Status: DC | PRN

## 2021-06-14 NOTE — ED Triage Notes (Signed)
Pt reports sharp lower back pain x 2 weeks.  OTC pain meds not helping.  No pain, numbness or tingling in BLE. No known injury. Works in [employer].

## 2021-06-14 NOTE — ED Provider Notes (Signed)
[facility]    CSN: [id number] Arrival date & time: 06/14/21  1111      History   Chief Complaint Chief Complaint  Patient presents with   Back Pain    HPI Jose Williams is a 26 y.o. male.   Patient presents today with a several week history of lumbar back pain that has worsened in the past several days.  As result of worsening pain he has missed work.  Pain is rated 10 on a 0-10 pain scale, localized to lumbar midline back, described as aching with periodic sharp pains, no aggravating or alleviating factors identified.  He has tried over-the-counter medications with minimal improvement of symptoms.  Denies any weakness, numbness, paresthesias.  He is able to ambulate though it is limited due to pain.  Denies any previous injury or surgery involving his back.  Denies history of malignancy.  Denies any bowel/bladder incontinence, lower extremity weakness, saddle anesthesia.    Past Medical History:  Diagnosis Date   Allergic rhinitis 04/25/2014   Bone lesion 04/25/2014   Obesity 04/25/2014   Sickle cell trait (HCC) 04/25/2014    Patient Active Problem List   Diagnosis Date Noted   Encounter for HIV screening and discussion of pre-exposure prophylaxis for HIV 05/26/2021   Knee pain 05/26/2021   BMI 35.0-35.9,adult 05/26/2021   Screening examination for sexually transmitted disease 05/26/2021   Sinusitis, chronic 01/09/2015   Chronic tension headaches 01/09/2015   Sickle cell trait (HCC) 04/25/2014   Obesity 04/25/2014   Lytic bone lesions on xray 04/25/2014    History reviewed. No pertinent surgical history.     Home Medications    Prior to Admission medications   Medication Sig Start Date End Date Taking? Authorizing Provider  emtricitabine-tenofovir (TRUVADA) 200-300 MG tablet Take 1 tablet by mouth daily. 06/01/21  Yes Sabino Dick, DO  methocarbamol (ROBAXIN) 500 MG tablet Take 1 tablet (500 mg total) by mouth every 8 (eight) hours as needed  for muscle spasms. 06/14/21  Yes Shatia Sindoni, Noberto Retort, PA-C    Family History Family History  Family history unknown: Yes    Social History Social History   Tobacco Use   Smoking status: Never   Smokeless tobacco: Never  Vaping Use   Vaping Use: Never used  Substance Use Topics   Alcohol use: Yes    Comment: occ   Drug use: No     Allergies   Patient has no known allergies.   Review of Systems Review of Systems  Constitutional:  Positive for activity change. Negative for appetite change, fatigue and fever.  Gastrointestinal:  Negative for abdominal pain, diarrhea, nausea and vomiting.  Musculoskeletal:  Positive for back pain. Negative for arthralgias and myalgias.  Neurological:  Negative for dizziness, weakness, light-headedness, numbness and headaches.     Physical Exam Triage Vital Signs ED Triage Vitals  Enc Vitals Group     BP 06/14/21 1246 105/66     Pulse Rate 06/14/21 1245 (!) 54     Resp 06/14/21 1245 16     Temp 06/14/21 1245 98 F (36.7 C)     Temp Source 06/14/21 1245 Oral     SpO2 06/14/21 1245 100 %     Weight --      Height --      Head Circumference --      Peak Flow --      Pain Score --      Pain Loc --      Pain  Edu? --      Excl. in GC? --    No data found.  Updated Vital Signs BP 105/66 (BP Location: Right Arm)   Pulse (!) 54   Temp 98 F (36.7 C) (Oral)   Resp 16   SpO2 100%   Visual Acuity Right Eye Distance:   Left Eye Distance:   Bilateral Distance:    Right Eye Near:   Left Eye Near:    Bilateral Near:     Physical Exam Vitals reviewed.  Constitutional:      General: He is awake.     Appearance: Normal appearance. He is well-developed. He is not ill-appearing.     Comments: Very pleasant male appears at age in no acute distress sitting comfortably in exam room  HENT:     Head: Normocephalic and atraumatic.     Mouth/Throat:     Pharynx: No oropharyngeal exudate, posterior oropharyngeal erythema or uvula swelling.   Cardiovascular:     Rate and Rhythm: Normal rate and regular rhythm.     Heart sounds: Normal heart sounds, S1 normal and S2 normal. No murmur heard. Pulmonary:     Effort: Pulmonary effort is normal.     Breath sounds: Normal breath sounds. No stridor. No wheezing, rhonchi or rales.     Comments: Clear to auscultation bilaterally Abdominal:     General: Bowel sounds are normal.     Palpations: Abdomen is soft.     Tenderness: There is no abdominal tenderness. There is no right CVA tenderness, left CVA tenderness, guarding or rebound.     Comments: Benign abdominal exam  Musculoskeletal:     Cervical back: No tenderness or bony tenderness.     Thoracic back: No tenderness or bony tenderness.     Lumbar back: Tenderness and bony tenderness present. Normal range of motion. Negative right straight leg raise test and negative left straight leg raise test.     Comments: Pain percussion of lumbar vertebrae.  No deformity or step-off noticed.  Tenderness palpation of bilateral lumbar paraspinal muscles.  Negative straight leg raise bilaterally.  Strength 5/5 bilateral lower extremities.  Negative Faber.  Neurological:     Mental Status: He is alert.  Psychiatric:        Behavior: Behavior is cooperative.      UC Treatments / Results  Labs (all labs ordered are listed, but only abnormal results are displayed) Labs Reviewed  POCT URINALYSIS DIPSTICK, ED / UC    EKG   Radiology DG Lumbar Spine Complete  Result Date: 06/14/2021 CLINICAL DATA:  Lambert Mody low back pain over the last 2 weeks. EXAM: LUMBAR SPINE - COMPLETE 4+ VIEW COMPARISON:  None Available. FINDINGS: There is no evidence of lumbar spine fracture. Alignment is normal. Intervertebral disc spaces are maintained. IMPRESSION: Negative. Electronically Signed   By: Marin Roberts M.D.   On: 06/14/2021 13:42    Procedures Procedures (including critical care time)  Medications Ordered in UC Medications - No data to  display  Initial Impression / Assessment and Plan / UC Course  I have reviewed the triage vital signs and the nursing notes.  Pertinent labs & imaging results that were available during my care of the patient were reviewed by me and considered in my medical decision making (see chart for details).     X-ray obtained given bony tenderness which showed no acute osseous abnormality.  UA was normal.  Discussed likely muscular etiology of symptoms.  Patient was encouraged to use over-the-counter  medication such as Tylenol for pain relief.  Recommend heat, rest, stretch for symptom relief.  He was prescribed Robaxin to use up to 3 times a day with instruction not to drive or drink alcohol with taking this medication as drowsiness is a common side effect.  If symptoms or not improving he is to follow-up with sports medicine was given contact information for local provider with instruction to call to schedule an appointment.  He was provided work excuse note as requested.  Discussed that if he has any worsening symptoms including fever, worsening pain, nausea/vomiting, lower extremity weakness, saddle anesthesia, bowel/bladder incontinence he is to go to the emergency room to which he expressed understanding.    Final Clinical Impressions(s) / UC Diagnoses   Final diagnoses:  Acute midline low back pain without sciatica     Discharge Instructions      Your x-ray and urine were normal.  Please continue over-the-counter medication for symptom management.  I believe that your symptoms are related to a muscle strain.  Avoid strenuous activity including heavy lifting for the next several days.  Use Robaxin 500 mg up to 3 times a day.  This will make you sleepy so do not drive or drink alcohol with taking it.  Use heat and gentle stretch for symptom relief.  If your symptoms or not improving please follow-up with sports medicine; call to schedule an appointment.  If anything worsens and you have fever, severe  pain, weakness in your legs, going to the bathroom on yourself without noticing it, numbness/tingling in your legs you should be seen immediately.     ED Prescriptions     Medication Sig Dispense Auth. Provider   methocarbamol (ROBAXIN) 500 MG tablet Take 1 tablet (500 mg total) by mouth every 8 (eight) hours as needed for muscle spasms. 21 tablet Nakiyah Beverley, Noberto RetortErin K, PA-C      PDMP not reviewed this encounter.   Jeani HawkingRaspet, Javonte Elenes K, PA-C 06/14/21 1353

## 2021-06-14 NOTE — Discharge Instructions (Signed)
Your x-ray and urine were normal.  Please continue over-the-counter medication for symptom management.  I believe that your symptoms are related to a muscle strain.  Avoid strenuous activity including heavy lifting for the next several days.  Use Robaxin 500 mg up to 3 times a day.  This will make you sleepy so do not drive or drink alcohol with taking it.  Use heat and gentle stretch for symptom relief.  If your symptoms or not improving please follow-up with sports medicine; call to schedule an appointment.  If anything worsens and you have fever, severe pain, weakness in your legs, going to the bathroom on yourself without noticing it, numbness/tingling in your legs you should be seen immediately.

## 2022-10-26 ENCOUNTER — Encounter (HOSPITAL_COMMUNITY): Payer: Self-pay

## 2022-10-26 ENCOUNTER — Ambulatory Visit (HOSPITAL_COMMUNITY)
Admission: EM | Admit: 2022-10-26 | Discharge: 2022-10-26 | Disposition: A | Discharge status: Home / Self Care | Payer: Managed Care, Other (non HMO) | Attending: Emergency Medicine | Admitting: Emergency Medicine

## 2022-10-26 DIAGNOSIS — Z113 Encounter for screening for infections with a predominantly sexual mode of transmission: Secondary | ICD-10-CM | POA: Diagnosis not present

## 2022-10-26 LAB — HIV ANTIBODY (ROUTINE TESTING W REFLEX): HIV Screen 4th Generation wRfx: NONREACTIVE

## 2022-10-26 NOTE — Discharge Instructions (Addendum)
We will call you if anything on your swab or blood work returns positive. You can also see these results on MyChart. Please abstain from sexual intercourse until your results return.

## 2022-10-26 NOTE — ED Provider Notes (Signed)
[facility]    CSN: [id number] Arrival date & time: 10/26/22  1553     History   Chief Complaint Chief Complaint  Patient presents with   SEXUALLY TRANSMITTED DISEASE   Abdominal Pain    HPI Jose Williams is a 27 y.o. male.  Here for STD testing Denies any symptoms or known exposures No discharge, dysuria, lesions or rash  Last week was having lower abdominal discomfort with diarrhea. Resolved. Tolerating fluids. No vomiting.  On PrEP. Most recent negative HIV test was 1 year ago  Past Medical History:  Diagnosis Date   Allergic rhinitis 04/25/2014   Bone lesion 04/25/2014   Obesity 04/25/2014   Sickle cell trait (HCC) 04/25/2014    Patient Active Problem List   Diagnosis Date Noted   Encounter for HIV screening and discussion of pre-exposure prophylaxis for HIV 05/26/2021   Knee pain 05/26/2021   BMI 35.0-35.9,adult 05/26/2021   Screening examination for sexually transmitted disease 05/26/2021   Sinusitis, chronic 01/09/2015   Chronic tension headaches 01/09/2015   Sickle cell trait (HCC) 04/25/2014   Obesity 04/25/2014   Lytic bone lesions on xray 04/25/2014    History reviewed. No pertinent surgical history.  Home Medications    Prior to Admission medications   Medication Sig Start Date End Date Taking? Authorizing Provider  emtricitabine-tenofovir (TRUVADA) 200-300 MG tablet Take 1 tablet by mouth daily. 06/01/21   Sabino Dick, DO    Family History Family History  Family history unknown: Yes    Social History Social History   Tobacco Use   Smoking status: Never   Smokeless tobacco: Never  Vaping Use   Vaping status: Never Used  Substance Use Topics   Alcohol use: Yes    Comment: occ   Drug use: No     Allergies   Patient has no known allergies.   Review of Systems Review of Systems Per HPI  Physical Exam Triage Vital Signs ED Triage Vitals  Encounter Vitals Group     BP 10/26/22 1645 122/83     Systolic  BP Percentile --      Diastolic BP Percentile --      Pulse Rate 10/26/22 1645 84     Resp 10/26/22 1645 18     Temp 10/26/22 1645 99 F (37.2 C)     Temp Source 10/26/22 1645 Oral     SpO2 10/26/22 1645 98 %     Weight --      Height --      Head Circumference --      Peak Flow --      Pain Score 10/26/22 1644 0     Pain Loc --      Pain Education --      Exclude from Growth Chart --    No data found.  Updated Vital Signs BP 122/83 (BP Location: Left Arm)   Pulse 84   Temp 99 F (37.2 C) (Oral)   Resp 18   SpO2 98%    Physical Exam Vitals and nursing note reviewed.  Constitutional:      General: He is not in acute distress.    Appearance: Normal appearance.  Cardiovascular:     Rate and Rhythm: Normal rate and regular rhythm.     Heart sounds: Normal heart sounds.  Pulmonary:     Effort: Pulmonary effort is normal.     Breath sounds: Normal breath sounds.  Abdominal:     Palpations: Abdomen is soft. There is  no mass.     Tenderness: There is no abdominal tenderness. There is no guarding.  Skin:    General: Skin is warm and dry.  Neurological:     Mental Status: He is alert and oriented to person, place, and time.     UC Treatments / Results  Labs (all labs ordered are listed, but only abnormal results are displayed) Labs Reviewed  HIV ANTIBODY (ROUTINE TESTING W REFLEX)  RPR  CYTOLOGY, (ORAL, ANAL, URETHRAL) ANCILLARY ONLY    EKG  Radiology No results found.  Procedures Procedures   Medications Ordered in UC Medications - No data to display  Initial Impression / Assessment and Plan / UC Course  I have reviewed the triage vital signs and the nursing notes.  Pertinent labs & imaging results that were available during my care of the patient were reviewed by me and considered in my medical decision making (see chart for details).  STD testing Cytology swab pending including HIV/RPR Treat positive result as indicated Safe sex practices No  questions at this time  Final Clinical Impressions(s) / UC Diagnoses   Final diagnoses:  Screen for STD (sexually transmitted disease)     Discharge Instructions      We will call you if anything on your swab or blood work returns positive. You can also see these results on MyChart. Please abstain from sexual intercourse until your results return.     ED Prescriptions   None    PDMP not reviewed this encounter.   Gara Kincade, Lurena Joiner, [place] 10/26/22 1723

## 2022-10-26 NOTE — ED Triage Notes (Signed)
Pt presents for STD testing. States last week he had some lower abd pain with diarrhea.  Denies any known exposures or discharge. States he sometime has testicular pain, but that it "may be normal."

## 2022-10-27 LAB — RPR: RPR Ser Ql: NONREACTIVE

## 2022-10-27 LAB — CYTOLOGY, (ORAL, ANAL, URETHRAL) ANCILLARY ONLY
Chlamydia: NEGATIVE
Comment: NEGATIVE
Comment: NORMAL
Neisseria Gonorrhea: NEGATIVE
